# Patient Record
Sex: Female | Born: 1980 | Race: Black or African American | Hispanic: No | Marital: Married | Smoking: Never smoker
Health system: Southern US, Community
[De-identification: ages and names within clinical notes are randomized; demographics above are authoritative.]

## PROBLEM LIST (undated history)

## (undated) DIAGNOSIS — Z973 Presence of spectacles and contact lenses: Secondary | ICD-10-CM

## (undated) DIAGNOSIS — Z8489 Family history of other specified conditions: Secondary | ICD-10-CM

## (undated) DIAGNOSIS — A609 Anogenital herpesviral infection, unspecified: Secondary | ICD-10-CM

## (undated) HISTORY — PX: ANKLE SURGERY: SHX546

## (undated) HISTORY — PX: INDUCED ABORTION: SHX677

## (undated) HISTORY — DX: Anogenital herpesviral infection, unspecified: A60.9

---

## 2006-10-19 HISTORY — PX: ANKLE SURGERY: SHX546

## 2016-11-19 NOTE — L&D Delivery Note (Signed)
Delivery Note Pt complete at 1745. She pushed for 40-74mins and at 6:39 PM a viable female was delivered via Vaginal, Spontaneous Delivery (Presentation:OA ;  ).  APGAR: 9,9 ; weight pending.   Placenta status:delivered, shultz, intact , .  Cord:3vc  with the following complications: nuchal x 1; reduced over infants head.  Cord pH: n/a  Anesthesia:  Epidural Episiotomy: None Lacerations: 2nd degree;Perineal Suture Repair: 2.0 vicryl 4-0 vicryl Est. Blood Loss (mL): 450  Mom to postpartum.  Baby to Couplet care / Skin to Skin.  Isaiah Serge 08/02/2017, 7:05 PM

## 2017-01-03 LAB — OB RESULTS CONSOLE ANTIBODY SCREEN: Antibody Screen: NEGATIVE

## 2017-01-03 LAB — OB RESULTS CONSOLE HEPATITIS B SURFACE ANTIGEN: Hepatitis B Surface Ag: NEGATIVE

## 2017-01-03 LAB — OB RESULTS CONSOLE HIV ANTIBODY (ROUTINE TESTING): HIV: NONREACTIVE

## 2017-01-03 LAB — OB RESULTS CONSOLE ABO/RH: RH Type: POSITIVE

## 2017-01-03 LAB — OB RESULTS CONSOLE RUBELLA ANTIBODY, IGM: Rubella: IMMUNE

## 2017-01-03 LAB — OB RESULTS CONSOLE RPR: RPR: NONREACTIVE

## 2017-03-15 ENCOUNTER — Other Ambulatory Visit (HOSPITAL_COMMUNITY): Payer: Self-pay | Admitting: Obstetrics and Gynecology

## 2017-03-15 DIAGNOSIS — O09521 Supervision of elderly multigravida, first trimester: Secondary | ICD-10-CM

## 2017-04-04 ENCOUNTER — Ambulatory Visit (HOSPITAL_COMMUNITY)
Admission: RE | Admit: 2017-04-04 | Discharge: 2017-04-04 | Disposition: A | Discharge status: Home / Self Care | Payer: 59 | Source: Ambulatory Visit | Attending: Obstetrics and Gynecology | Admitting: Obstetrics and Gynecology

## 2017-04-04 DIAGNOSIS — O09522 Supervision of elderly multigravida, second trimester: Secondary | ICD-10-CM | POA: Diagnosis not present

## 2017-04-04 DIAGNOSIS — Z3A22 22 weeks gestation of pregnancy: Secondary | ICD-10-CM | POA: Insufficient documentation

## 2017-04-04 DIAGNOSIS — O99212 Obesity complicating pregnancy, second trimester: Secondary | ICD-10-CM | POA: Diagnosis not present

## 2017-04-04 DIAGNOSIS — Z3689 Encounter for other specified antenatal screening: Secondary | ICD-10-CM | POA: Diagnosis not present

## 2017-04-04 DIAGNOSIS — E669 Obesity, unspecified: Secondary | ICD-10-CM | POA: Diagnosis not present

## 2017-04-04 DIAGNOSIS — Z6833 Body mass index (BMI) 33.0-33.9, adult: Secondary | ICD-10-CM | POA: Diagnosis not present

## 2017-04-04 DIAGNOSIS — O09521 Supervision of elderly multigravida, first trimester: Secondary | ICD-10-CM

## 2017-07-04 LAB — OB RESULTS CONSOLE GBS: STREP GROUP B AG: NEGATIVE

## 2017-07-30 ENCOUNTER — Inpatient Hospital Stay (HOSPITAL_COMMUNITY): Admission: RE | Admit: 2017-07-30 | Payer: 59 | Source: Ambulatory Visit

## 2017-08-01 ENCOUNTER — Telehealth (HOSPITAL_COMMUNITY): Payer: Self-pay | Admitting: *Deleted

## 2017-08-01 ENCOUNTER — Encounter (HOSPITAL_COMMUNITY): Payer: Self-pay | Admitting: *Deleted

## 2017-08-01 NOTE — Telephone Encounter (Signed)
Preadmission screen  

## 2017-08-01 NOTE — H&P (Signed)
Felicia Banks is a 36 y.o. female presenting for scheduled elective iol at term. Pt had a benign prenatal course. Dating per LMP and confirmed with first trimester [place]. GBS neg. Pt requests elective iol for discomfort and social reasons  OB History    Gravida Para Term Preterm AB Living   2       1     SAB TAB Ectopic Multiple Live Births     1           Past Medical History:  Diagnosis Date  . HSV (herpes simplex virus) anogenital infection    Past Surgical History:  Procedure Laterality Date  . ANKLE SURGERY     left  . INDUCED ABORTION     Family History: family history is not on file. Social History:  has no tobacco, alcohol, and drug history on file.     Maternal Diabetes: No Genetic Screening: Normal Maternal Ultrasounds/Referrals: Normal Fetal Ultrasounds or other Referrals:  None Maternal Substance Abuse:  No Significant Maternal Medications:  None Significant Maternal Lab Results:  Lab values include: Group B Strep negative Other Comments:  None  Review of Systems  Constitutional: Positive for malaise/fatigue. Negative for chills, fever and weight loss.  Eyes: Negative for blurred vision.  Respiratory: Negative for shortness of breath.   Cardiovascular: Negative for chest pain.  Gastrointestinal: Positive for abdominal pain. Negative for heartburn, nausea and vomiting.  Genitourinary: Negative for dysuria.  Musculoskeletal: Positive for back pain and myalgias.  Skin: Negative for itching and rash.  Neurological: Negative for dizziness, tingling and headaches.  Endo/Heme/Allergies: Negative for environmental allergies. Does not bruise/bleed easily.  Psychiatric/Behavioral: Negative for depression, hallucinations, substance abuse and suicidal ideas. The patient is nervous/anxious.    Maternal Medical History:  Reason for admission: Contractions.  Nausea. Elective iol  Contractions: Onset was 2 days ago.   Frequency: irregular and rare.   Perceived severity  is moderate.    Fetal activity: Perceived fetal activity is normal.   Last perceived fetal movement was within the past hour.    Prenatal complications: no prenatal complications Prenatal Complications - Diabetes: none.      Last menstrual period 10/28/2016. Maternal Exam:  Uterine Assessment: Contraction strength is mild.  Contraction frequency is irregular and rare.   Abdomen: Patient reports generalized tenderness.  Estimated fetal weight is AGA.   Fetal presentation: vertex  Introitus: Normal vulva. Vulva is negative for condylomata and lesion.  Normal vagina.  Vagina is negative for condylomata and discharge.  Pelvis: adequate for delivery.   Cervix: Cervix evaluated by digital exam.     Physical Exam  Constitutional: She is oriented to person, place, and time. She appears well-developed and well-nourished.  HENT:  Head: Normocephalic.  Neck: Normal range of motion.  Cardiovascular: Normal rate.   Respiratory: Effort normal.  GI: Soft. Bowel sounds are normal. There is generalized tenderness.  Genitourinary: Vagina normal and uterus normal. Vulva exhibits no lesion. No vaginal discharge found.  Musculoskeletal: Normal range of motion. She exhibits edema.  Neurological: She is alert and oriented to person, place, and time.  Skin: Skin is warm.  Psychiatric: She has a normal mood and affect. Her behavior is normal. Judgment and thought content normal.    Prenatal labs: ABO, Rh: O/Positive/-- (02/15 0000) Antibody: Negative (02/15 0000) Rubella: Immune (02/15 0000) RPR: Nonreactive (02/15 0000)  HBsAg: Negative (02/15 0000)  HIV: Non-reactive (02/15 0000)  GBS: Negative (08/16 0000)   Assessment/Plan: G2P0010 at term for elective iol Cytotec  for ripening then pitocin/AROM to follow  Pain control prn Anticipate svd GBS neg   Venetia Night Banga 08/01/2017, 1:14 PM

## 2017-08-02 ENCOUNTER — Encounter (HOSPITAL_COMMUNITY): Payer: Self-pay

## 2017-08-02 ENCOUNTER — Inpatient Hospital Stay (HOSPITAL_COMMUNITY): Payer: 59 | Admitting: Anesthesiology

## 2017-08-02 ENCOUNTER — Inpatient Hospital Stay (HOSPITAL_COMMUNITY)
Admission: RE | Admit: 2017-08-02 | Discharge: 2017-08-04 | DRG: 774 | Disposition: A | Discharge status: Home / Self Care | Payer: 59 | Source: Ambulatory Visit | Attending: Obstetrics and Gynecology | Admitting: Obstetrics and Gynecology

## 2017-08-02 DIAGNOSIS — Z3A39 39 weeks gestation of pregnancy: Secondary | ICD-10-CM

## 2017-08-02 DIAGNOSIS — O9832 Other infections with a predominantly sexual mode of transmission complicating childbirth: Secondary | ICD-10-CM | POA: Diagnosis present

## 2017-08-02 DIAGNOSIS — A6000 Herpesviral infection of urogenital system, unspecified: Secondary | ICD-10-CM | POA: Diagnosis present

## 2017-08-02 DIAGNOSIS — O26893 Other specified pregnancy related conditions, third trimester: Secondary | ICD-10-CM | POA: Diagnosis present

## 2017-08-02 DIAGNOSIS — Z3490 Encounter for supervision of normal pregnancy, unspecified, unspecified trimester: Secondary | ICD-10-CM

## 2017-08-02 LAB — CBC
HCT: 31 % — ABNORMAL LOW (ref 36.0–46.0)
HEMATOCRIT: 34.9 % — AB (ref 36.0–46.0)
Hemoglobin: 12.4 g/dL (ref 12.0–15.0)
Hemoglobin: 10.8 g/dL — ABNORMAL LOW (ref 12.0–15.0)
MCH: 28.7 pg (ref 26.0–34.0)
MCH: 28.6 pg (ref 26.0–34.0)
MCHC: 35.5 g/dL (ref 30.0–36.0)
MCHC: 34.8 g/dL (ref 30.0–36.0)
MCV: 80.8 fL (ref 78.0–100.0)
MCV: 82 fL (ref 78.0–100.0)
Platelets: 165 10*3/uL (ref 150–400)
Platelets: 149 10*3/uL — ABNORMAL LOW (ref 150–400)
RBC: 4.32 MIL/uL (ref 3.87–5.11)
RBC: 3.78 MIL/uL — ABNORMAL LOW (ref 3.87–5.11)
RDW: 16.9 % — AB (ref 11.5–15.5)
RDW: 16.8 % — ABNORMAL HIGH (ref 11.5–15.5)
WBC: 8.2 10*3/uL (ref 4.0–10.5)
WBC: 10.9 10*3/uL — ABNORMAL HIGH (ref 4.0–10.5)

## 2017-08-02 LAB — RPR: RPR Ser Ql: NONREACTIVE

## 2017-08-02 LAB — TYPE AND SCREEN
ABO/RH(D): O POS
Antibody Screen: NEGATIVE

## 2017-08-02 LAB — ABO/RH: ABO/RH(D): O POS

## 2017-08-02 MED ORDER — EPHEDRINE 5 MG/ML INJ
10.0000 mg | INTRAVENOUS | Status: DC | PRN
  Filled 2017-08-02: qty 2

## 2017-08-02 MED ORDER — WITCH HAZEL-GLYCERIN EX PADS: 1.0000 "application " | MEDICATED_PAD | CUTANEOUS | Status: DC | PRN

## 2017-08-02 MED ORDER — ONDANSETRON HCL 4 MG/2ML IJ SOLN: 4.0000 mg | INTRAMUSCULAR | Status: DC | PRN

## 2017-08-02 MED ORDER — SOD CITRATE-CITRIC ACID 500-334 MG/5ML PO SOLN: 30.0000 mL | ORAL | Status: DC | PRN

## 2017-08-02 MED ORDER — MISOPROSTOL 25 MCG QUARTER TABLET
25.0000 ug | ORAL_TABLET | ORAL | Status: DC | PRN
  Administered 2017-08-02: 25 ug via VAGINAL
  Filled 2017-08-02 (×2): qty 1

## 2017-08-02 MED ORDER — ZOLPIDEM TARTRATE 5 MG PO TABS: 5.0000 mg | ORAL_TABLET | Freq: Every evening | ORAL | Status: DC | PRN

## 2017-08-02 MED ORDER — TETANUS-DIPHTH-ACELL PERTUSSIS 5-2.5-18.5 LF-MCG/0.5 IM SUSP: 0.5000 mL | Freq: Once | INTRAMUSCULAR | Status: DC

## 2017-08-02 MED ORDER — LACTATED RINGERS IV SOLN
500.0000 mL | INTRAVENOUS | Status: DC | PRN
  Administered 2017-08-02: 300 mL via INTRAVENOUS

## 2017-08-02 MED ORDER — ACETAMINOPHEN 325 MG PO TABS
650.0000 mg | ORAL_TABLET | ORAL | Status: DC | PRN
Start: 2017-08-02 — End: 2017-08-04

## 2017-08-02 MED ORDER — SIMETHICONE 80 MG PO CHEW: 80.0000 mg | CHEWABLE_TABLET | ORAL | Status: DC | PRN

## 2017-08-02 MED ORDER — LIDOCAINE HCL (PF) 1 % IJ SOLN
INTRAMUSCULAR | Status: DC | PRN
  Administered 2017-08-02: 8 mL via EPIDURAL

## 2017-08-02 MED ORDER — FENTANYL 2.5 MCG/ML BUPIVACAINE 1/10 % EPIDURAL INFUSION (WH - ANES)
14.0000 mL/h | INTRAMUSCULAR | Status: DC | PRN
  Administered 2017-08-02 (×2): 14 mL/h via EPIDURAL
  Filled 2017-08-02 (×2): qty 100

## 2017-08-02 MED ORDER — OXYCODONE HCL 5 MG PO TABS: 5.0000 mg | ORAL_TABLET | ORAL | Status: DC | PRN

## 2017-08-02 MED ORDER — DIPHENHYDRAMINE HCL 50 MG/ML IJ SOLN: 12.5000 mg | INTRAMUSCULAR | Status: DC | PRN

## 2017-08-02 MED ORDER — TERBUTALINE SULFATE 1 MG/ML IJ SOLN
0.2500 mg | Freq: Once | INTRAMUSCULAR | Status: DC | PRN
  Filled 2017-08-02: qty 1

## 2017-08-02 MED ORDER — LIDOCAINE HCL (PF) 1 % IJ SOLN
30.0000 mL | INTRAMUSCULAR | Status: DC | PRN
  Filled 2017-08-02: qty 30

## 2017-08-02 MED ORDER — DIBUCAINE 1 % RE OINT: 1.0000 "application " | TOPICAL_OINTMENT | RECTAL | Status: DC | PRN

## 2017-08-02 MED ORDER — PRENATAL MULTIVITAMIN CH
1.0000 | ORAL_TABLET | Freq: Every day | ORAL | Status: DC
  Administered 2017-08-03: 1 via ORAL
  Filled 2017-08-02: qty 1

## 2017-08-02 MED ORDER — SENNOSIDES-DOCUSATE SODIUM 8.6-50 MG PO TABS
2.0000 | ORAL_TABLET | ORAL | Status: DC
  Filled 2017-08-02 (×2): qty 2

## 2017-08-02 MED ORDER — ACETAMINOPHEN 325 MG PO TABS: 650.0000 mg | ORAL_TABLET | ORAL | Status: DC | PRN

## 2017-08-02 MED ORDER — OXYCODONE HCL 5 MG PO TABS: 10.0000 mg | ORAL_TABLET | ORAL | Status: DC | PRN

## 2017-08-02 MED ORDER — ONDANSETRON HCL 4 MG/2ML IJ SOLN: 4.0000 mg | Freq: Four times a day (QID) | INTRAMUSCULAR | Status: DC | PRN

## 2017-08-02 MED ORDER — PHENYLEPHRINE 40 MCG/ML (10ML) SYRINGE FOR IV PUSH (FOR BLOOD PRESSURE SUPPORT)
80.0000 ug | PREFILLED_SYRINGE | INTRAVENOUS | Status: DC | PRN
  Filled 2017-08-02: qty 5
  Filled 2017-08-02: qty 10

## 2017-08-02 MED ORDER — LACTATED RINGERS IV SOLN: INTRAVENOUS | Status: DC

## 2017-08-02 MED ORDER — BENZOCAINE-MENTHOL 20-0.5 % EX AERO
1.0000 "application " | INHALATION_SPRAY | CUTANEOUS | Status: DC | PRN
  Administered 2017-08-03: 1 via TOPICAL
  Filled 2017-08-02: qty 56

## 2017-08-02 MED ORDER — COCONUT OIL OIL: 1.0000 "application " | TOPICAL_OIL | Status: DC | PRN

## 2017-08-02 MED ORDER — OXYTOCIN BOLUS FROM INFUSION
500.0000 mL | Freq: Once | INTRAVENOUS | Status: AC
  Administered 2017-08-02: 500 mL via INTRAVENOUS

## 2017-08-02 MED ORDER — LACTATED RINGERS IV SOLN
INTRAVENOUS | Status: DC
  Administered 2017-08-02 (×2): via INTRAVENOUS

## 2017-08-02 MED ORDER — OXYTOCIN 40 UNITS IN LACTATED RINGERS INFUSION - SIMPLE MED: 2.5000 [IU]/h | INTRAVENOUS | Status: DC

## 2017-08-02 MED ORDER — OXYCODONE-ACETAMINOPHEN 5-325 MG PO TABS: 1.0000 | ORAL_TABLET | ORAL | Status: DC | PRN

## 2017-08-02 MED ORDER — PHENYLEPHRINE 40 MCG/ML (10ML) SYRINGE FOR IV PUSH (FOR BLOOD PRESSURE SUPPORT)
80.0000 ug | PREFILLED_SYRINGE | INTRAVENOUS | Status: DC | PRN
  Filled 2017-08-02: qty 5

## 2017-08-02 MED ORDER — OXYTOCIN 40 UNITS IN LACTATED RINGERS INFUSION - SIMPLE MED
1.0000 m[IU]/min | INTRAVENOUS | Status: DC
  Administered 2017-08-02: 2 m[IU]/min via INTRAVENOUS
  Filled 2017-08-02: qty 1000

## 2017-08-02 MED ORDER — DIPHENHYDRAMINE HCL 25 MG PO CAPS: 25.0000 mg | ORAL_CAPSULE | Freq: Four times a day (QID) | ORAL | Status: DC | PRN

## 2017-08-02 MED ORDER — BUTORPHANOL TARTRATE 1 MG/ML IJ SOLN: 1.0000 mg | INTRAMUSCULAR | Status: DC | PRN

## 2017-08-02 MED ORDER — IBUPROFEN 600 MG PO TABS
600.0000 mg | ORAL_TABLET | Freq: Four times a day (QID) | ORAL | Status: DC
  Administered 2017-08-03 – 2017-08-04 (×6): 600 mg via ORAL
  Filled 2017-08-02 (×6): qty 1

## 2017-08-02 MED ORDER — ONDANSETRON HCL 4 MG PO TABS: 4.0000 mg | ORAL_TABLET | ORAL | Status: DC | PRN

## 2017-08-02 MED ORDER — OXYCODONE-ACETAMINOPHEN 5-325 MG PO TABS: 2.0000 | ORAL_TABLET | ORAL | Status: DC | PRN

## 2017-08-02 MED ORDER — LACTATED RINGERS IV SOLN: 500.0000 mL | Freq: Once | INTRAVENOUS | Status: DC

## 2017-08-02 NOTE — Progress Notes (Signed)
Pt up to BR. Pt stated she didn't feel good and felt like she was going to pass out. Pt did pass out. RN called for assistance and pt helped back to bed.

## 2017-08-02 NOTE — Anesthesia Procedure Notes (Signed)
Epidural Patient location during procedure: OB Start time: 08/02/2017 10:27 AM End time: 08/02/2017 10:32 AM  Staffing Anesthesiologist: Lyn Hollingshead Performed: anesthesiologist   Preanesthetic Checklist Completed: patient identified, surgical consent, pre-op evaluation, timeout performed, IV checked, risks and benefits discussed and monitors and equipment checked  Epidural Patient position: sitting Prep: site prepped and draped and DuraPrep Patient monitoring: continuous pulse ox and blood pressure Approach: midline Location: L3-L4 Injection technique: LOR air  Needle:  Needle type: Tuohy  Needle gauge: 17 G Needle length: 9 cm and 9 Needle insertion depth: 9 cm Catheter type: closed end flexible Catheter size: 19 Gauge Catheter at skin depth: 15 cm Test dose: negative and Other  Assessment Sensory level: T10 Events: blood not aspirated, injection not painful, no injection resistance, negative IV test and no paresthesia  Additional Notes Reason for block:procedure for pain

## 2017-08-02 NOTE — Anesthesia Pain Management Evaluation Note (Signed)
  CRNA Pain Management Visit Note  Patient: Felicia Banks, 36 y.o., female  "Hello I am a member of the anesthesia team at [facility]. We have an anesthesia team available at all times to provide care throughout the hospital, including epidural management and anesthesia for C-section. I don't know your plan for the delivery whether it a natural birth, water birth, IV sedation, nitrous supplementation, doula or epidural, but we want to meet your pain goals."   1.Was your pain managed to your expectations on prior hospitalizations?   Yes   2.What is your expectation for pain management during this hospitalization?     Epidural  3.How can we help you reach that goal? unsure  Record the patient's initial score and the patient's pain goal.   Pain: 6  Pain Goal: 8 [facility] wants you to be able to say your pain was always managed very well.  Felicia Banks 08/02/2017

## 2017-08-02 NOTE — Anesthesia Preprocedure Evaluation (Signed)
Anesthesia Evaluation  Patient identified by MRN, date of birth, ID band Patient awake    Reviewed: Allergy & Precautions, H&P , NPO status , Patient's Chart, lab work & pertinent test results  Airway Mallampati: II  TM Distance: >3 FB Neck ROM: full    Dental no notable dental hx. (+) Teeth Intact   Pulmonary neg pulmonary ROS,    Pulmonary exam normal breath sounds clear to auscultation       Cardiovascular negative cardio ROS Normal cardiovascular exam Rhythm:regular Rate:Normal     Neuro/Psych negative neurological ROS  negative psych ROS   GI/Hepatic negative GI ROS, Neg liver ROS,   Endo/Other  negative endocrine ROS  Renal/GU negative Renal ROS     Musculoskeletal   Abdominal (+) + obese,   Peds  Hematology negative hematology ROS (+)   Anesthesia Other Findings   Reproductive/Obstetrics (+) Pregnancy                             Anesthesia Physical Anesthesia Plan  ASA: II  Anesthesia Plan: Epidural   Post-op Pain Management:    Induction:   PONV Risk Score and Plan:   Airway Management Planned:   Additional Equipment:   Intra-op Plan:   Post-operative Plan:   Informed Consent: I have reviewed the patients History and Physical, chart, labs and discussed the procedure including the risks, benefits and alternatives for the proposed anesthesia with the patient or authorized representative who has indicated his/her understanding and acceptance.     Plan Discussed with:   Anesthesia Plan Comments:         Anesthesia Quick Evaluation

## 2017-08-02 NOTE — Progress Notes (Signed)
Patient ID: Felicia Banks, female   DOB: 1981/01/07, 36 y.o.   MRN: [id number] Pt reports painful contractions.  EFM - 130, cat 1 TOCO - ctxs q 1-33mins Cervix 3cm/90/-3 Attempted AROM; pitocin per protocol Pain control prn Recheck in 2hrs or prn

## 2017-08-02 NOTE — Progress Notes (Signed)
Patient ID: Felicia Banks, female   DOB: 05/07/1981, 36 y.o.   MRN: [id number] Pt doing well. Comfortable with epidural VSS EFM - cat 1 130 TOCO - ctxs q 66mins SVE 4/90/-2  AROM performed with moderate return of clear fluid ( pink tinged  A/P: G2P0010 at 39 + in early active labor, pit at 82mus         Continue pit per protocol         Recheck in 2-3hrs or prn         Anticipate svd

## 2017-08-02 NOTE — Progress Notes (Signed)
Patient ID: Felicia Banks, female   DOB: 11/25/1980, 36 y.o.   MRN: [id number] Pt still doing well VSS Cat 1, 130  Ctxs q 58mins 8/90/-2 Pit at 38mus  A/P: G2P0: Progressing on pit         Continue current mgmt         Recheck in 2-3hrs          Anticipate svd

## 2017-08-03 ENCOUNTER — Encounter (HOSPITAL_COMMUNITY): Payer: Self-pay

## 2017-08-03 LAB — CBC
HEMATOCRIT: 29.2 % — AB (ref 36.0–46.0)
Hemoglobin: 10.1 g/dL — ABNORMAL LOW (ref 12.0–15.0)
MCH: 28.5 pg (ref 26.0–34.0)
MCHC: 34.6 g/dL (ref 30.0–36.0)
MCV: 82.3 fL (ref 78.0–100.0)
Platelets: 162 10*3/uL (ref 150–400)
RBC: 3.55 MIL/uL — AB (ref 3.87–5.11)
RDW: 17 % — AB (ref 11.5–15.5)
WBC: 12.9 10*3/uL — AB (ref 4.0–10.5)

## 2017-08-03 NOTE — Progress Notes (Signed)
Patient ID: Felicia Banks, female   DOB: Apr 06, 1981, 36 y.o.   MRN: [id number] PPD #1 Has passed out twice when standing Afeb, VSS Fundus firm, NT at U-1 Hgb 10.6 to 10.1 Continue routine postpartum care, ambulate with assistance, push fluids

## 2017-08-03 NOTE — Anesthesia Postprocedure Evaluation (Signed)
Anesthesia Post Note  Patient: Felicia Banks  Procedure(s) Performed: * No procedures listed *     Patient location during evaluation: Mother Baby Anesthesia Type: Epidural Level of consciousness: awake and alert Pain management: pain level controlled Vital Signs Assessment: post-procedure vital signs reviewed and stable Respiratory status: spontaneous breathing, nonlabored ventilation and respiratory function stable Cardiovascular status: stable Postop Assessment: no headache, no backache, epidural receding and patient able to bend at knees Anesthetic complications: no    Last Vitals:  Vitals:   08/02/17 2302 08/03/17 0017  BP: 130/76 103/70  Pulse: (!) 109 98  Resp: 18 20  Temp: 37.2 C 37.4 C  SpO2: 100% 99%    Last Pain:  Vitals:   08/03/17 0017  TempSrc: Oral  PainSc: 4    Pain Goal:                 Rayvon Char

## 2017-08-04 ENCOUNTER — Inpatient Hospital Stay (HOSPITAL_COMMUNITY): Admission: AD | Admit: 2017-08-04 | Payer: Self-pay | Source: Ambulatory Visit | Admitting: Obstetrics and Gynecology

## 2017-08-04 MED ORDER — IBUPROFEN 600 MG PO TABS: 600.0000 mg | ORAL_TABLET | Freq: Four times a day (QID) | ORAL | 0 refills | Status: DC

## 2017-08-04 NOTE — Discharge Instructions (Signed)
As per discharge pamphlet °

## 2017-08-04 NOTE — Discharge Summary (Signed)
OB Discharge Summary     Patient Name: Felicia Banks DOB: Aug 14, 1981 MRN: [id number]  Date of admission: 08/02/2017 Delivering MD: Carlynn Purl [facility]   Date of discharge: 08/04/2017  Admitting diagnosis: INDUCTION Intrauterine pregnancy: [redacted]w[redacted]d     Secondary diagnosis:  Active Problems:   Term pregnancy   SVD (spontaneous vaginal delivery)   Postpartum care following vaginal delivery      Discharge diagnosis: Term Pregnancy [facility] course:  Induction of Labor With Vaginal Delivery   36 y.o. yo G2P1010 at [redacted]w[redacted]d was admitted to the hospital 08/02/2017 for induction of labor.  Indication for induction: Favorable cervix at term.  Patient had an uncomplicated labor course as follows: Membrane Rupture Time/Date: 1:16 PM ,08/02/2017   Intrapartum Procedures: Episiotomy: None [1]                                         Lacerations:  2nd degree [3];Perineal [11]  Patient had delivery of a Viable infant.  Information for the patient's newborn:  Felicia, Banks Girl Felicia [[id number]]  Delivery Method: Vaginal, Spontaneous Delivery (Filed from Delivery Summary)   08/02/2017  Details of delivery can be found in separate delivery note.  Patient had a routine postpartum course. Patient is discharged home 08/04/17.  Physical exam  Vitals:   08/03/17 0017 08/03/17 0900 08/03/17 1807 08/04/17 0700  BP: 103/70 (!) 122/54 133/71 119/74  Pulse: 98 (!) 103 100 86  Resp: 20 18 19 18   Temp: 99.4 F (37.4 C) 98.9 F (37.2 C) 98 F (36.7 C) 98 F (36.7 C)  TempSrc: Oral Oral Oral Oral  SpO2: 99% 99% 95% 99%  Weight:      Height:       General: alert Lochia: appropriate Uterine Fundus: firm  Labs: Lab Results  Component Value Date   WBC 12.9 (H) 08/03/2017   HGB 10.1 (L) 08/03/2017   HCT 29.2 (L) 08/03/2017   MCV 82.3 08/03/2017   PLT 162 08/03/2017   No flowsheet data found.  Discharge instruction: per After Visit Summary and "Baby and Me  Booklet".  After visit meds:  Allergies as of 08/04/2017   No Known Allergies     Medication List    STOP taking these medications   folic acid 1 MG tablet Commonly known as:  FOLVITE     TAKE these medications   ibuprofen 600 MG tablet Commonly known as:  ADVIL,MOTRIN Take 1 tablet (600 mg total) by mouth every 6 (six) hours.   prenatal multivitamin Tabs tablet Take 1 tablet by mouth daily at 12 noon.   valACYclovir 500 MG tablet Commonly known as:  VALTREX            Discharge Care Instructions        Start     Ordered   08/04/17 0000  ibuprofen (ADVIL,MOTRIN) 600 MG tablet  Every 6 hours     08/04/17 0827   08/04/17 0000  Diet - low sodium heart healthy     08/04/17 0827      Diet: routine diet  Activity: Advance as tolerated. Pelvic rest for 6 weeks.   Outpatient follow up:3 weeks  Newborn Data: Live born female  Birth Weight:  8 lb 6.4 oz (3810 g) APGAR: 9, 9  Baby Feeding: Breast Disposition:home with mother   08/04/2017 Felicia Duke, MD

## 2017-08-04 NOTE — Lactation Note (Signed)
This note was copied from a baby's chart. Lactation Consultation Note  Patient Name: Felicia Banks [id number] Date: 08/04/2017 Reason for consult: Initial assessment;Infant weight loss  Baby is 38 hours old/ 5% weight loss.  LC reviewed and updated doc flow sheets per mom  As [place] entered room, baby latched on the left breast / football/ wrapped in a blanket and  And wearing a T- Shirt. LC noted the baby to be non - nutritive , showed mom breast compressions  And noted the baby's feeding pattern to pick up slightly. Baby released and noted the nipple to be slanted.  So depth wasn't achieved.  LC changed a larger wet and unwrapped , and placed baby STS on the right breast / 1st cross cradle,  And baby wouldn't stay latched , so switched to the football. LC assisted to latch and showed mom how to obtain  The depth. Baby has a tendency to nibble on to the breast.  [place] worked with mom to tickle the upper  lip and wait for the baby to open wide to allow for a deeper latch.  Depth achieved, but the baby sits, occasional sucking.  LC showed dad and grandmother how they could assist to insert the 9FSNS so the baby will be in an active  Feeding pattern when latched. Per mom the baby had a are hard time settling down last night.  Right nipple noted to have a positional strip, and the both areolas have edema , semi - compressible.  LC feels the mom may have not been obtaining the depth consistently.  Sore  Nipple and engorgement prevention and tx reviewed.  LC recommended wearing breast shells between feedings to enhance the areola to be more compressible  Prior to the latch - breast massage , hand express, pre-pump if needed , latch with breast compressions until swallows.  Then use the 9F SNS and supplement if needed.  Due to moms extra bleeding - suggested after some rest at home , add extra post pumping after 3-4 feedings until milk  Comes in, and supplement back to baby.  Mother informed of  post-discharge support and given phone number to the lactation department, including services for phone call assistance; out-patient appointments; and breastfeeding support group. List of other breastfeeding resources in the community given in the handout. Encouraged mother to call for problems or concerns related to breastfeeding.  Per mom has a DEBP at home.     Maternal Data Has patient been taught Hand Expression?: Yes (unable to express drops ) Does the patient have breastfeeding experience prior to this delivery?: No  Feeding Feeding Type: Formula Length of feed: 15 min  LATCH Score Latch: Grasps breast easily, tongue down, lips flanged, rhythmical sucking.  Audible Swallowing: Spontaneous and intermittent  Type of Nipple: Everted at rest and after stimulation (swollen areolas , semi compressible / positional strips )  Comfort (Breast/Nipple): Soft / non-tender  Hold (Positioning): Assistance needed to correctly position infant at breast and maintain latch.  LATCH Score: 9  Interventions Interventions: Breast feeding basics reviewed;Assisted with latch;Skin to skin;Breast massage;Hand express;Pre-pump if needed;Reverse pressure;Breast compression;Adjust position;Support pillows;Position options;Shells;Comfort gels;Hand pump;DEBP  Lactation Tools Discussed/Used Tools: Shells;Pump;9F feeding tube / Syringe Shell Type: Inverted Breast pump type: Manual;Double-Electric Breast Pump WIC Program: No   Consult Status Consult Status: Complete Date: 08/04/17 Follow-up type: In-patient    [place] 08/04/2017, 10:02 AM

## 2017-08-04 NOTE — Progress Notes (Signed)
Patient ID: Felicia Banks, female   DOB: [birth year], 36 y.o.   MRN: [id number] PPD #2 Doing well Afeb, VSS D/c home

## 2017-08-06 ENCOUNTER — Telehealth (HOSPITAL_COMMUNITY): Payer: Self-pay | Admitting: Lactation Services

## 2017-08-06 NOTE — Telephone Encounter (Signed)
Called mom back at request of OP clinic. Mom reports she is very engorged and is unable to feed her infant. Discussed ice packs x 20 minutes followed by reverse pressure and pumping for 15-20 minutes using hands on pumping,  repeating process every 2 hours until engorgement resolved. Discussed importance of resolving engorgement to protect milk supply. Mom has [place] DEBP at home for use. Enc mom to put infant back to breast as soon as areola is softer. Mom reports she spoke to [facility] and has given infant formula, enc her to give EBM once flowing and to get infant back to breast. Mom reports infant is 56 days old and asked how much to feed infant, informed her 40-60 ml every 3 hours, she is very concerned she has not been able to feed her infant this morning at the breast. Mom voiced understanding to the above. Mom has appt scheduled for tomorrow and will let us know if she is feeling better in the morning.

## 2018-07-05 IMAGING — US US MFM OB DETAIL+14 WK
1 series · 14 of 28 positions shown · non-contrast
Comparison: none

[Series 1: us mfm ob detail+14 wk · 95 acquisitions, 14 frames shown]
[im 4/95]
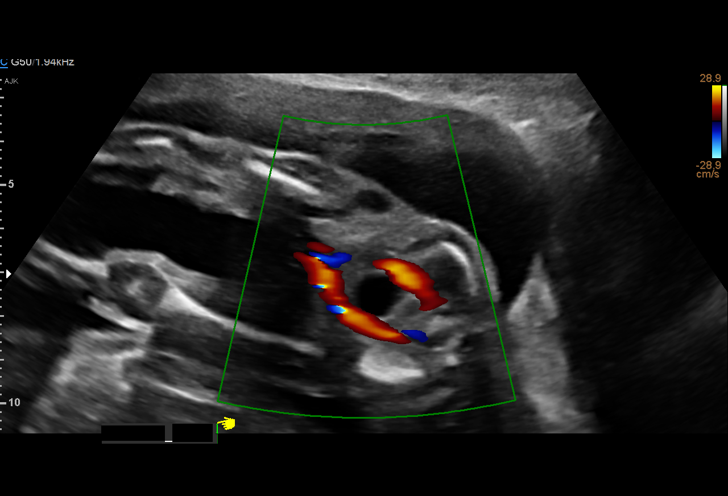
[im 11/95]
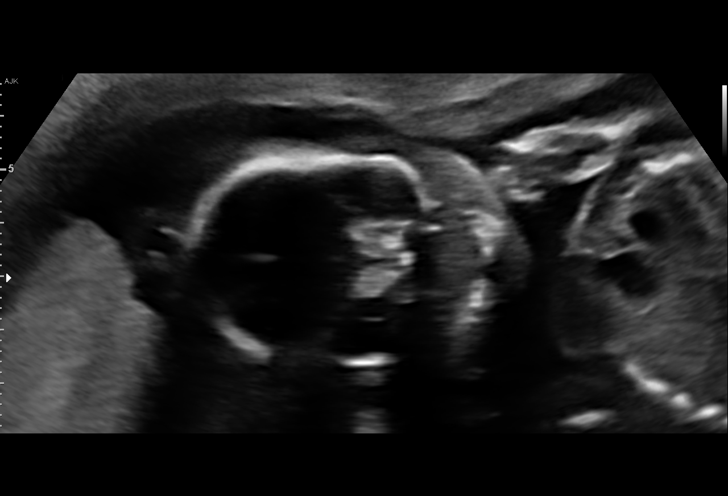
[im 18/95]
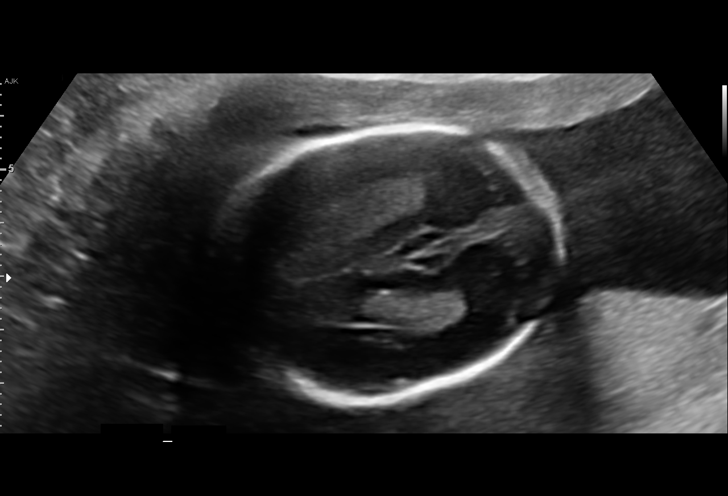
[im 25/95]
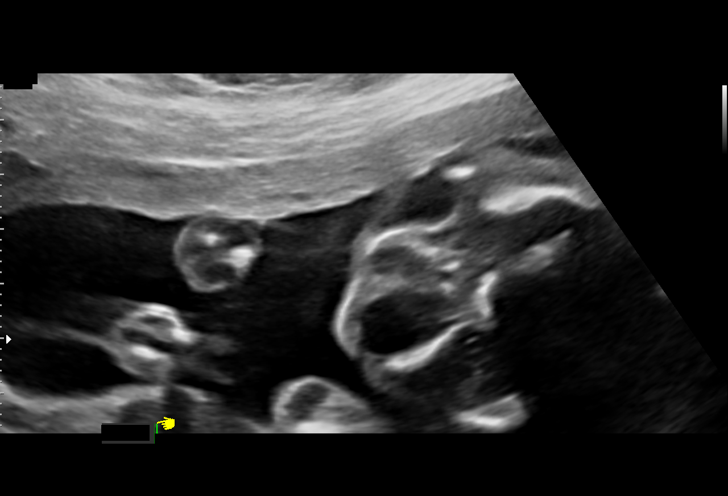
[im 32/95]
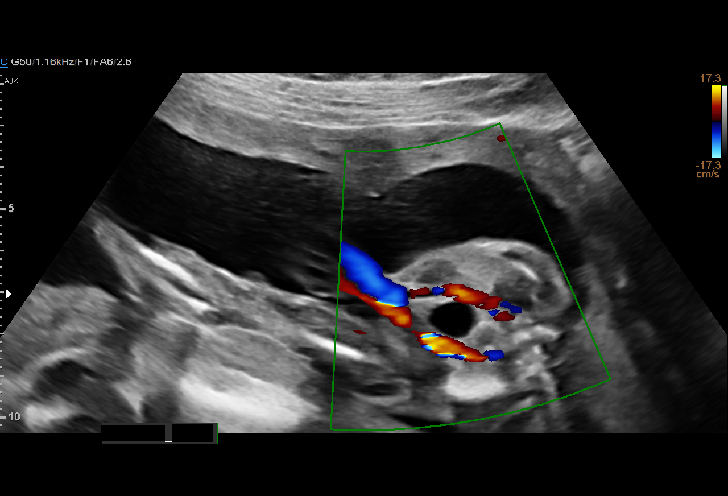
[im 39/95]
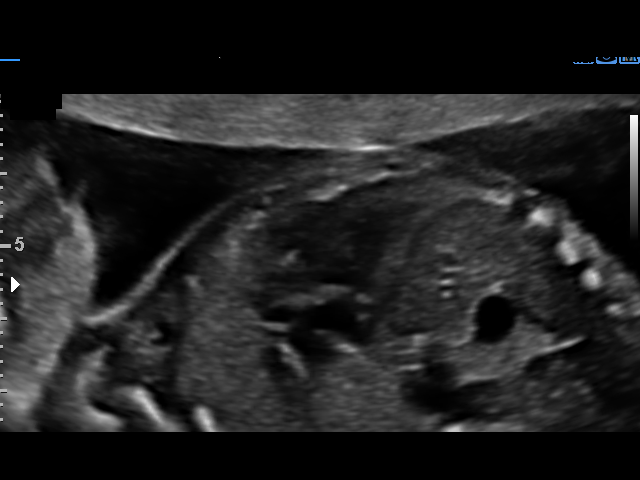
[im 46/95]
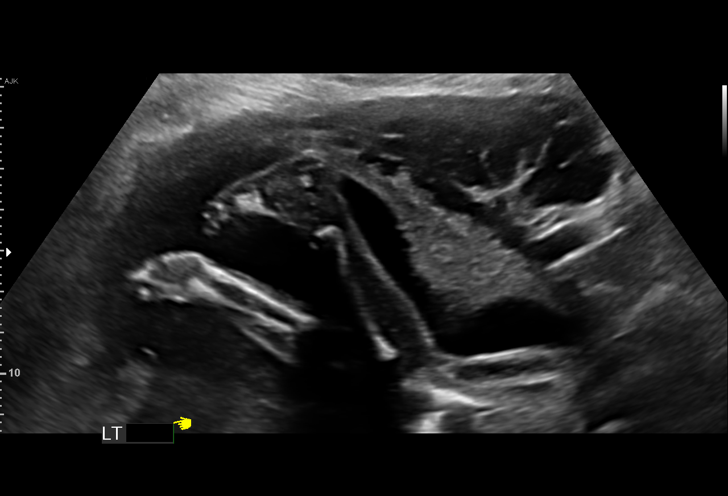
[im 53/95]
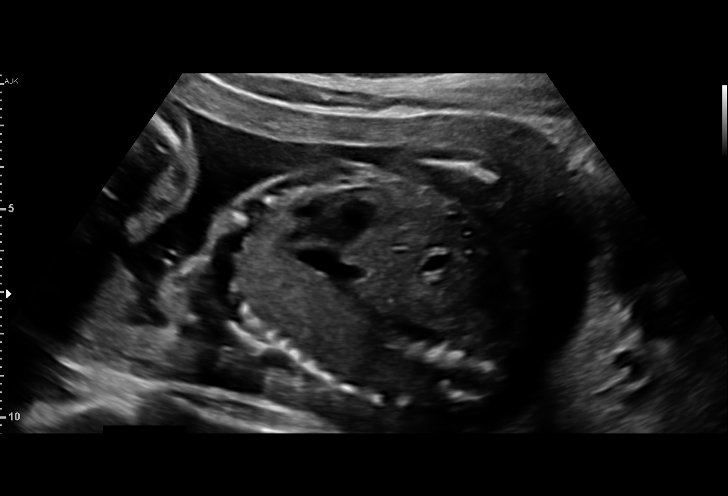
[im 60/95]
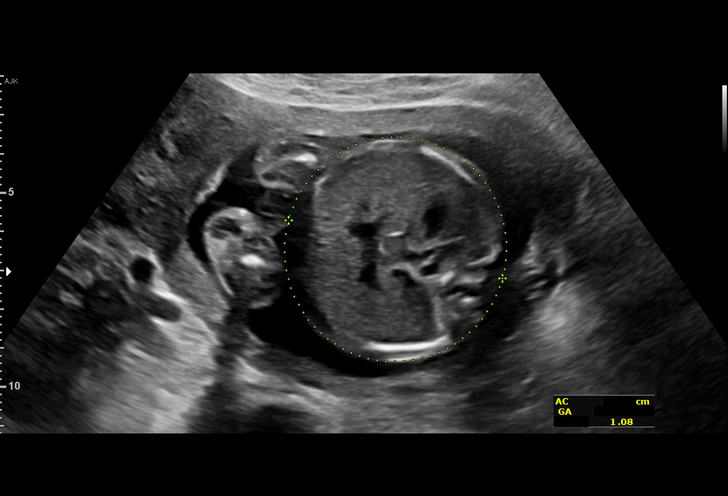
[im 67/95]
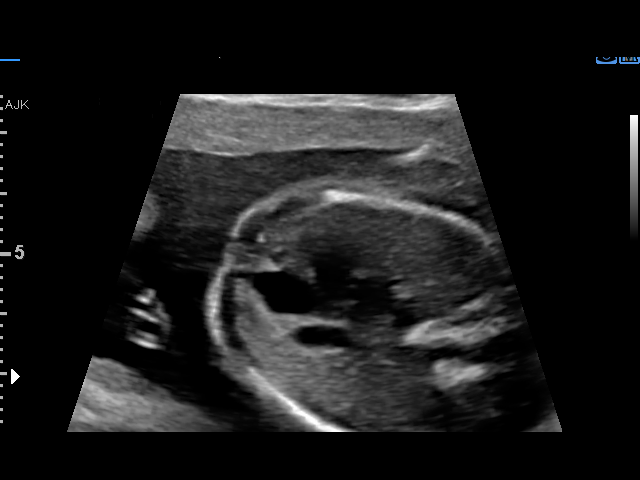
[im 74/95]
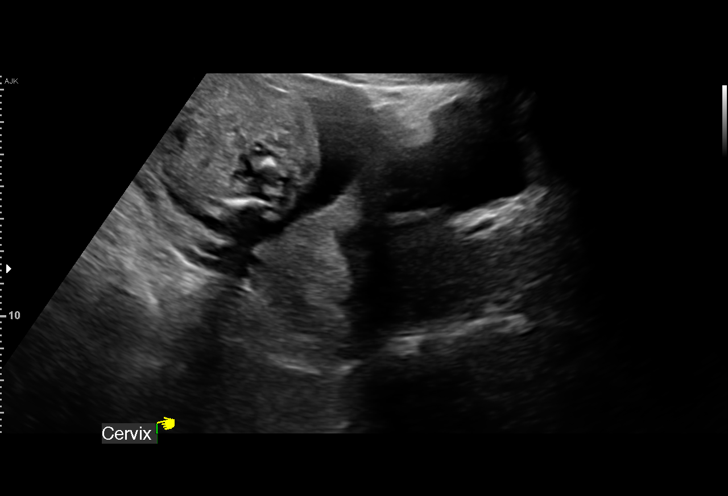
[im 81/95]
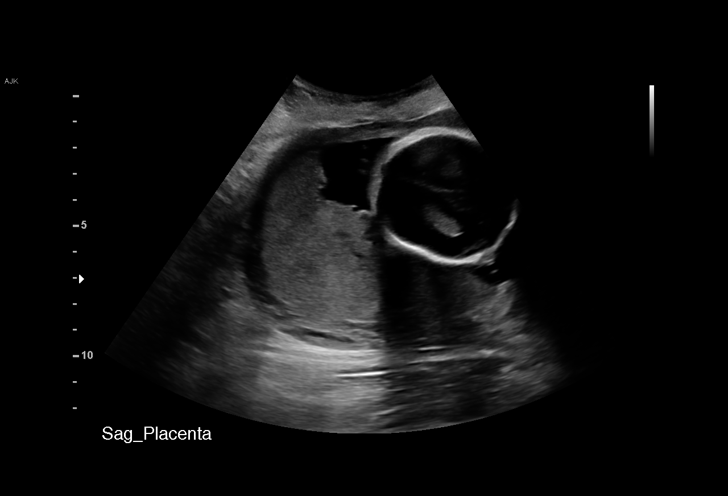
[im 88/95]
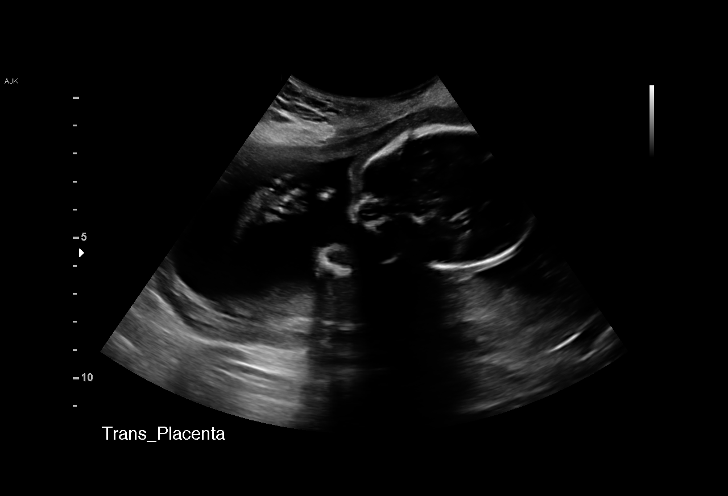
[im 95/95]
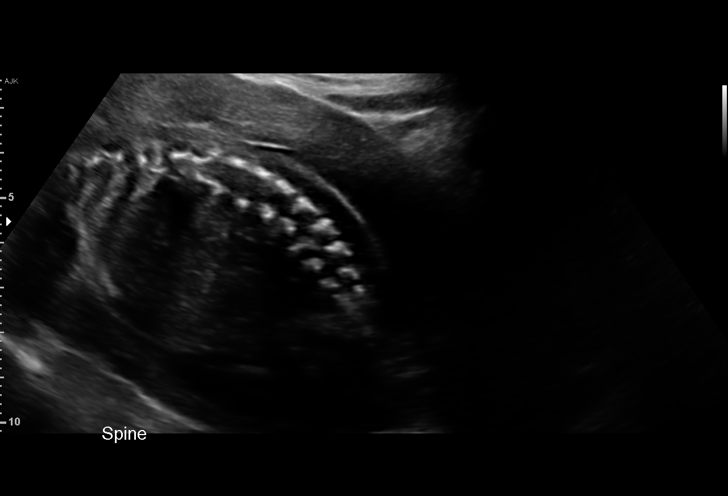

[14 of 28 positions shown; findings below may reference images not displayed]

#101

1  BARTON AISPURO            [id number]      [id number]     [id number]
Indications

22 weeks gestation of pregnancy
Advanced maternal age multigravida 35+,
second trimester
Obesity complicating pregnancy, second
trimester (low risk panorama)
Encounter for fetal anatomic survey
OB History

Blood Type:            Height:  5'10"  Weight (lb):  233      BMI:
Gravidity:    2         Term:   0        Prem:   0        SAB:   0
TOP:          1       Ectopic:  0        Living: 0
Fetal Evaluation

Num Of Fetuses:     1
Fetal Heart         150
Rate(bpm):
Cardiac Activity:   Observed
Presentation:       Breech
Placenta:           Posterior, above cervical os
P. Cord Insertion:  Visualized, central

Amniotic Fluid
AFI FV:      Subjectively within normal limits

Largest Pocket(cm)
3.93
Biometry

BPD:      50.2  mm     G. Age:  21w 1d          7  %    CI:        66.74   %   70 - 86
FL/HC:      20.3   %   19.2 -
HC:      196.9  mm     G. Age:  21w 6d         14  %    HC/AC:      1.08       1.05 -
AC:      181.9  mm     G. Age:  23w 0d         57  %    FL/BPD:     79.7   %   71 - 87
FL:         40  mm     G. Age:  22w 6d         51  %    FL/AC:      22.0   %   20 - 24

Est. FW:     534  gm      1 lb 3 oz     54  %
Gestational Age

LMP:           22w 4d       Date:   10/28/16                 EDD:   08/04/17
U/S Today:     22w 2d                                        EDD:   08/06/17
Best:          22w 4d    Det. By:   LMP  (10/28/16)          EDD:   08/04/17
Anatomy

Cranium:               Appears normal         Aortic Arch:            Appears normal
Cavum:                 Appears normal         Ductal Arch:            Not well visualized
Ventricles:            Appears normal         Diaphragm:              Appears normal
Choroid Plexus:        Appears normal         Stomach:                Appears normal, left
sided
Cerebellum:            Appears normal         Abdomen:                Appears normal
Posterior Fossa:       Appears normal         Abdominal Wall:         Appears nml (cord
insert, abd wall)
Nuchal Fold:           Not applicable (>20    Cord Vessels:           Appears normal (3
wks GA)                                        vessel cord)
Face:                  Appears normal         Kidneys:                Appear normal
(orbits and profile)
Lips:                  Appears normal         Bladder:                Appears normal
Thoracic:              Appears normal         Spine:                  Ltd views no
intracranial signs of
NT
Heart:                 Appears normal         Upper Extremities:      Appears normal
(4CH, axis, and situs
RVOT:                  Appears normal         Lower Extremities:      Appears normal
LVOT:                  Appears normal

Other:  Fetus appears to be a female. Heels visualized. Nasal bone
visualized.Technically difficult due to maternal habitus and fetal
position.
Cervix Uterus Adnexa

Cervix
Length:           3.49  cm.
Normal appearance by transabdominal scan.

Uterus
No abnormality visualized.

Left Ovary
Not visualized.

Right Ovary
Not visualized.

Adnexa:       No abnormality visualized.
Impression

Singleton intrauterine pregnancy at 22+4 weeks, here for
anatomic survey
Review of the anatomy shows no sonographic markers for
aneuploidy or structural anomalies
However, evaluations should be considered suboptimal
secondary to maternal habitus and fetal position
Amniotic fluid volume is normal
Estimated fetal weight is 534g which is growth in the 54th
percentile
Recommendations

All anatomy was visualized except aortic arch. A repeat scan
can be considered to visualize this, at your discretion.
Otherwise, follow-up ultrasounds as clinically indicated.

## 2021-02-05 ENCOUNTER — Encounter (HOSPITAL_COMMUNITY): Payer: Self-pay | Admitting: Emergency Medicine

## 2021-02-05 ENCOUNTER — Emergency Department (HOSPITAL_COMMUNITY)
Admission: EM | Admit: 2021-02-05 | Discharge: 2021-02-05 | Disposition: A | Discharge status: Home / Self Care | Payer: Medicaid Other | Attending: Emergency Medicine | Admitting: Emergency Medicine

## 2021-02-05 ENCOUNTER — Emergency Department (HOSPITAL_COMMUNITY): Payer: Medicaid Other

## 2021-02-05 DIAGNOSIS — R231 Pallor: Secondary | ICD-10-CM | POA: Diagnosis not present

## 2021-02-05 DIAGNOSIS — R42 Dizziness and giddiness: Secondary | ICD-10-CM | POA: Insufficient documentation

## 2021-02-05 DIAGNOSIS — R61 Generalized hyperhidrosis: Secondary | ICD-10-CM | POA: Diagnosis not present

## 2021-02-05 DIAGNOSIS — R072 Precordial pain: Secondary | ICD-10-CM | POA: Diagnosis present

## 2021-02-05 DIAGNOSIS — R0602 Shortness of breath: Secondary | ICD-10-CM | POA: Diagnosis not present

## 2021-02-05 LAB — BASIC METABOLIC PANEL
Anion gap: 6 (ref 5–15)
BUN: 11 mg/dL (ref 6–20)
CO2: 26 mmol/L (ref 22–32)
Calcium: 8.9 mg/dL (ref 8.9–10.3)
Chloride: 106 mmol/L (ref 98–111)
Creatinine, Ser: 0.87 mg/dL (ref 0.44–1.00)
GFR, Estimated: >60 mL/min (ref >60)
Glucose, Bld: 98 mg/dL (ref 70–99)
Potassium: 3.6 mmol/L (ref 3.5–5.1)
Sodium: 138 mmol/L (ref 135–145)

## 2021-02-05 LAB — CBC
HCT: 34.9 % — ABNORMAL LOW (ref 36.0–46.0)
Hemoglobin: 12.2 g/dL (ref 12.0–15.0)
MCH: 30 pg (ref 26.0–34.0)
MCHC: 35 g/dL (ref 30.0–36.0)
MCV: 85.7 fL (ref 80.0–100.0)
Platelets: 268 10*3/uL (ref 150–400)
RBC: 4.07 MIL/uL (ref 3.87–5.11)
RDW: 13.1 % (ref 11.5–15.5)
WBC: 7.7 10*3/uL (ref 4.0–10.5)
nRBC: 0 % (ref 0.0–0.2)

## 2021-02-05 LAB — I-STAT BETA HCG BLOOD, ED (MC, WL, AP ONLY): I-stat hCG, quantitative: <5 m[IU]/mL (ref <5)

## 2021-02-05 LAB — TROPONIN I (HIGH SENSITIVITY)
Troponin I (High Sensitivity): 18 ng/L — ABNORMAL HIGH (ref <18)
Troponin I (High Sensitivity): 20 ng/L — ABNORMAL HIGH (ref <18)

## 2021-02-05 MED ORDER — NITROGLYCERIN 0.4 MG SL SUBL: 0.4000 mg | SUBLINGUAL_TABLET | SUBLINGUAL | Status: DC | PRN

## 2021-02-05 MED ORDER — PANTOPRAZOLE SODIUM 40 MG IV SOLR
40.0000 mg | Freq: Once | INTRAVENOUS | Status: AC
  Administered 2021-02-05: 40 mg via INTRAVENOUS
  Filled 2021-02-05: qty 40

## 2021-02-05 MED ORDER — LIDOCAINE VISCOUS HCL 2 % MT SOLN
15.0000 mL | Freq: Once | OROMUCOSAL | Status: AC
Start: 2021-02-05 — End: 2021-02-05
  Administered 2021-02-05: 15 mL via ORAL
  Filled 2021-02-05: qty 15

## 2021-02-05 MED ORDER — PANTOPRAZOLE SODIUM 20 MG PO TBEC: 20.0000 mg | DELAYED_RELEASE_TABLET | Freq: Every day | ORAL | 0 refills | Status: DC

## 2021-02-05 MED ORDER — ALUM & MAG HYDROXIDE-SIMETH 400-400-40 MG/5ML PO SUSP: 10.0000 mL | Freq: Four times a day (QID) | ORAL | 0 refills | Status: DC | PRN

## 2021-02-05 MED ORDER — METHOCARBAMOL 500 MG PO TABS: 500.0000 mg | ORAL_TABLET | Freq: Three times a day (TID) | ORAL | 0 refills | Status: DC | PRN

## 2021-02-05 MED ORDER — ALUM & MAG HYDROXIDE-SIMETH 200-200-20 MG/5ML PO SUSP
30.0000 mL | Freq: Once | ORAL | Status: AC
  Administered 2021-02-05: 30 mL via ORAL
  Filled 2021-02-05: qty 30

## 2021-02-05 MED ORDER — METHOCARBAMOL 500 MG PO TABS
500.0000 mg | ORAL_TABLET | Freq: Once | ORAL | Status: AC
  Administered 2021-02-05: 500 mg via ORAL
  Filled 2021-02-05: qty 1

## 2021-02-05 NOTE — ED Provider Notes (Cosign Needed Addendum)
[facility] EMERGENCY DEPARTMENT Provider Note   CSN: [id number] Arrival date & time: 02/05/21  1352     History Chief Complaint  Patient presents with  . Chest Pain    Felicia Banks is a 40 y.o. female no past pertinent medical history.  Patient presents with chief complaint of chest pain.  Patient reports sudden onset at 1230 after walking up 3 small steps.  Chest pain is midsternal with no radiation.  Describes pain as a pressure.  Pain initially 10/10 on the pain scale.  Reports associated shortness of breath, clammy skin and lightheadedness.  Patient reports taking 324 mg of aspirin.  Patient states that her pain was improved after receiving 26mcg of fentanyl with EMS.  Patient states that she still feels pressure however is pain at present.    Patient denies any recent strenuous physical activity or heavy lifting.  Denies any relation of pain to eating states that she ate approximately 0900 this morning.   Patient denies any fevers, chills, URI symptoms, cough, palpitations, leg swelling, abdominal pain, dizziness, syncopal episode, headache, focal neurological deficit.  Patient denies any recent surgery in the last 12 weeks, history of PE or DVT, hormone therapy, hemoptysis, unilateral leg swelling or tenderness, cancer treatment, or prolonged immobilization.  Patient denies any alcohol, tobacco, or illicit drug use.  Patient states that she runs 4 to 7 miles per week.  Patient reports that she had a miscarriage in June 2021 and has not had a menstrual period since then.  Patient reports that she has seen her OB/GYN concerning this matter.    HPI    HPI: A 40 year old patient presents for evaluation of chest pain. Initial onset of pain was approximately 3-6 hours ago. The patient's chest pain is described as heaviness/pressure/tightness and is not worse with exertion. The patient reports some diaphoresis. The patient's chest pain is middle- or left-sided, is  not well-localized, is not sharp and does not radiate to the arms/jaw/neck. The patient does not complain of nausea. The patient has a family history of coronary artery disease in a first-degree relative with onset less than age 49. The patient has no history of stroke, has no history of peripheral artery disease, has not smoked in the past 90 days, denies any history of treated diabetes, is not hypertensive, has no history of hypercholesterolemia and does not have an elevated BMI (>=30).   Past Medical History:  Diagnosis Date  . HSV (herpes simplex virus) anogenital infection     Patient Active Problem List   Diagnosis Date Noted  . Term pregnancy 08/02/2017  . SVD (spontaneous vaginal delivery) 08/02/2017  . Postpartum care following vaginal delivery 08/02/2017    Past Surgical History:  Procedure Laterality Date  . ANKLE SURGERY     left  . INDUCED ABORTION       OB History    Gravida  2   Para  1   Term  1   Preterm      AB  1   Living        SAB      IAB  1   Ectopic      Multiple  0   Live Births              No family history on file.  Social History   Tobacco Use  . Smoking status: Never Smoker  . Smokeless tobacco: Never Used    Home Medications Prior to Admission medications  Medication Sig Start Date End Date Taking? Authorizing Provider  ibuprofen (ADVIL,MOTRIN) 600 MG tablet Take 1 tablet (600 mg total) by mouth every 6 (six) hours. 08/04/17   Meisinger, Sherren Mocha, MD  Prenatal Vit-Fe Fumarate-FA (PRENATAL MULTIVITAMIN) TABS tablet Take 1 tablet by mouth daily at 12 noon.    [provider]  valACYclovir (VALTREX) 500 MG tablet  07/11/17   [provider]    Allergies    Patient has no known allergies.  Review of Systems   Review of Systems  Constitutional: Positive for diaphoresis. Negative for chills and fever.  HENT: Negative for congestion, rhinorrhea and sore throat.   Eyes: Negative for visual disturbance.   Respiratory: Positive for shortness of breath. Negative for cough.   Cardiovascular: Positive for chest pain. Negative for palpitations and leg swelling.  Gastrointestinal: Negative for abdominal pain, constipation, diarrhea, nausea and vomiting.  Genitourinary: Negative for difficulty urinating, dysuria, frequency and hematuria.  Musculoskeletal: Negative for back pain and neck pain.  Skin: Negative for color change and rash.  Neurological: Positive for light-headedness. Negative for dizziness, tremors, seizures, syncope, facial asymmetry, speech difficulty, weakness, numbness and headaches.  Psychiatric/Behavioral: Negative for confusion.    Physical Exam Updated Vital Signs BP 127/71 (BP Location: Left Arm)   Pulse 74   Temp 99.3 F (37.4 C) (Oral)   Resp 16   SpO2 100%   Physical Exam Vitals and nursing note reviewed.  Constitutional:      General: She is not in acute distress.    Appearance: She is not ill-appearing, toxic-appearing or diaphoretic.  HENT:     Head: Normocephalic.  Eyes:     General: No scleral icterus.       Right eye: No discharge.        Left eye: No discharge.  Neck:     Vascular: No JVD.  Cardiovascular:     Rate and Rhythm: Normal rate.     Pulses:          Radial pulses are 3+ on the right side and 3+ on the left side.     Heart sounds: Normal heart sounds.  Pulmonary:     Effort: Pulmonary effort is normal. No tachypnea, bradypnea or respiratory distress.     Breath sounds: Normal breath sounds. No stridor.     Comments: Patient able to speak in full complete sentences without difficulty. Chest:     Chest wall: Tenderness present. No mass, lacerations, deformity, swelling, crepitus or edema.     Comments: Tenderness to mid sternum Abdominal:     General: There is no distension. There are no signs of injury.     Palpations: Abdomen is soft. There is no mass or pulsatile mass.     Tenderness: There is no abdominal tenderness. There is no  guarding or rebound.     Hernia: There is no hernia in the umbilical area or ventral area.  Musculoskeletal:     Cervical back: Neck supple.     Right lower leg: No swelling, deformity, lacerations, tenderness or bony tenderness. No edema.     Left lower leg: No swelling, deformity, lacerations, tenderness or bony tenderness. No edema.  Skin:    General: Skin is warm and dry.  Neurological:     General: No focal deficit present.     Mental Status: She is alert.     GCS: GCS eye subscore is 4. GCS verbal subscore is 5. GCS motor subscore is 6.  Psychiatric:  Mood and Affect: Mood is anxious.        Behavior: Behavior is cooperative.     ED Results / Procedures / Treatments   Labs (all labs ordered are listed, but only abnormal results are displayed) Labs Reviewed  CBC - Abnormal; Notable for the following components:      Result Value   HCT 34.9 (*)    All other components within normal limits  TROPONIN I (HIGH SENSITIVITY) - Abnormal; Notable for the following components:   Troponin I (High Sensitivity) 18 (*)    All other components within normal limits  TROPONIN I (HIGH SENSITIVITY) - Abnormal; Notable for the following components:   Troponin I (High Sensitivity) 20 (*)    All other components within normal limits  BASIC METABOLIC PANEL  I-STAT BETA HCG BLOOD, ED ([facility], [facility], [facility] ONLY)    EKG EKG Interpretation  Date/Time:  Sunday February 05 2021 14:09:02 EDT Ventricular Rate:  78 PR Interval:  124 QRS Duration: 82 QT Interval:  386 QTC Calculation: 440 R Axis:   33 Text Interpretation: Normal sinus rhythm Normal ECG agree, no old comparison Confirmed by Charlesetta Shanks [phone]) on 02/05/2021 5:43:56 PM   Radiology DG Chest 2 View  Result Date: 02/05/2021 CLINICAL DATA:  Midsternal chest pain. EXAM: CHEST - 2 VIEW COMPARISON:  None. FINDINGS: The cardiomediastinal contours are within normal limits. The lungs are clear. No pneumothorax or pleural effusion. No acute  finding in the visualized skeleton. IMPRESSION: No active cardiopulmonary disease. Electronically Signed   By: Audie Pinto M.D.   On: 02/05/2021 15:00    Procedures Procedures   Medications Ordered in ED Medications  alum & mag hydroxide-simeth (MAALOX/MYLANTA) 200-200-20 MG/5ML suspension 30 mL (30 mLs Oral Given 02/05/21 1942)    And  lidocaine (XYLOCAINE) 2 % viscous mouth solution 15 mL (15 mLs Oral Given 02/05/21 1942)  pantoprazole (PROTONIX) injection 40 mg (40 mg Intravenous Given 02/05/21 1947)  methocarbamol (ROBAXIN) tablet 500 mg (500 mg Oral Given 02/05/21 1942)    ED Course  I have reviewed the triage vital signs and the nursing notes.  Pertinent labs & imaging results that were available during my care of the patient were reviewed by me and considered in my medical decision making (see chart for details).    MDM Rules/Calculators/A&P HEAR Score: 2                        Alert 40 year old female in no acute distress, nontoxic appearing, patient appears anxious.  Presents with chief complaint of sudden onset midsternal chest pain with associated shortness of breath, lightheadedness, and clammy skin.  Patient describes her pain as a pressure.  Pain did not radiate.  Took 324 mg aspirin.  Patient reports pain was improved after receiving 50 mcg of fentanyl from EMS.  Patient denies any pain at present however still feels pressure to mid sternum.  Reactive auscultation S1-S2 present with no murmur rubs or gallops, +3 radial pulses bilaterally.  Lungs clear to auscultation bilaterally.  Patient able to speak in full complete sentences without difficulty.  Abdomen soft, nondistended, nontender.  No swelling or tenderness to lower extremities.  Low suspicion for PE as patient scores 0 on PERC rule for PE.  EKG shows normal sinus rhythm.  Chest x-ray shows no acute cardiopulmonary abnormality.  Initial troponin 18.  Will check second troponin.  Repeat troponin 20 with delta of  +2.  Low suspicion for ACS at this time.  Patient was given Protonix, GI cocktail, and Robaxin.  Patient was given prescription for the same medications.  Patient will follow up with her primary care provider as well as cardiology.  Patient is agreeable with this plan.  Discussed results, findings, treatment and follow up. Patient advised of return precautions. Patient verbalized understanding and agreed with plan.  Patient care and treatment were discussed with Dr. Vallery Ridge.  Final Clinical Impression(s) / ED Diagnoses Final diagnoses:  Precordial chest pain    Rx / DC Orders ED Discharge Orders         Ordered    methocarbamol (ROBAXIN) 500 MG tablet  Every 8 hours PRN        02/05/21 1936    pantoprazole (PROTONIX) 20 MG tablet  Daily        02/05/21 1936    alum & mag hydroxide-simeth [facility] MAXIMUM STRENGTH) 400-400-40 MG/5ML suspension  Every 6 hours PRN        02/05/21 1936           Loni Beckwith, PA-C 02/06/21 0000    Loni Beckwith, PA-C 02/06/21 0002    Charlesetta Shanks, MD 02/09/21 [phone]

## 2021-02-05 NOTE — ED Notes (Signed)
The pt is c/o mid-chest pain since this am some sob and she almost passed out  She also has some lt arm pain   No leg pain  No long car or air trips  No previous history  Alert no distress

## 2021-02-05 NOTE — Discharge Instructions (Addendum)
You came to the emergency department today to be evaluated for chest pain.  Your EKG, and lab work showed no signs of an acute heart attack.  Your chest x-ray showed no problems with your heart or lungs.  Your pain may have been related to a musculoskeletal issue or GI related issue.  I have given you prescription for Robaxin, Mylanta and Protonix.  Please take these medications as prescribed.  Please follow-up with your primary care provider and the cardiologist office provided on the paperwork. Today you were prescribed Methocarbamol (Robaxin).  Methocarbamol (Robaxin) is used to treat muscle spasms/pain.  It works by helping to relax the muscles.  Drowsiness, dizziness, lightheadedness, stomach upset, nausea/vomiting, or blurred vision may occur.  Do not drive, use machinery, or do anything that needs alertness or clear vision until you can do it safely.  Do not combine this medication with alcoholic beverages, marijuana, or other central nervous system depressants.     Get help right away if: Your chest pain gets worse. You have a cough that gets worse, or you cough up blood. You have severe pain in your abdomen. You faint. You have sudden, unexplained chest discomfort. You have sudden, unexplained discomfort in your arms, back, neck, or jaw. You have shortness of breath at any time. You suddenly start to sweat, or your skin gets clammy. You feel nausea or you vomit. You suddenly feel lightheaded or dizzy. You have severe weakness, or unexplained weakness or fatigue. Your heart begins to beat quickly, or it feels like it is skipping beats.

## 2021-02-05 NOTE — ED Triage Notes (Addendum)
Pt to triage via [facility].  Reports mid-sternal chest pain that started after walking up 3 steps.  Felt like she couldn't breathe and like she was going to pass out.  Went to fire station and was unable to get anyone so she broke the window trying to get in.  Left from fire dept and EMS was called to side of road.  Pt initially not talking to EMS.  20g L hand.  Fentanyl 50 mcg.  Denies any other symptoms.  Sharp pain relieved after Fentanyl but states she is still having pressure.

## 2021-02-08 ENCOUNTER — Ambulatory Visit (INDEPENDENT_AMBULATORY_CARE_PROVIDER_SITE_OTHER): Payer: Medicaid Other | Admitting: Cardiovascular Disease

## 2021-02-08 ENCOUNTER — Encounter: Payer: Self-pay | Admitting: Cardiovascular Disease

## 2021-02-08 ENCOUNTER — Other Ambulatory Visit: Payer: Self-pay

## 2021-02-08 VITALS — BP 124/72 | HR 72 | Ht 69.0 in | Wt 189.2 lb

## 2021-02-08 DIAGNOSIS — M940 Chondrocostal junction syndrome [Tietze]: Secondary | ICD-10-CM | POA: Diagnosis not present

## 2021-02-08 DIAGNOSIS — R079 Chest pain, unspecified: Secondary | ICD-10-CM

## 2021-02-08 MED ORDER — IBUPROFEN 800 MG PO TABS: 800.0000 mg | ORAL_TABLET | Freq: Three times a day (TID) | ORAL | 0 refills | Status: DC

## 2021-02-08 NOTE — Patient Instructions (Addendum)
Medication Instructions:  Start Ibuprofen 800 mg three times daily for 7 days. (please drink plenty of water with this)  *If you need a refill on your cardiac medications before your next appointment, please call your pharmacy*   Lab Work: TSH, ESR, CRP today  If you have labs (blood work) drawn today and your tests are completely normal, you will receive your results only by: Marland Kitchen MyChart Message (if you have MyChart) OR . A paper copy in the mail If you have any lab test that is abnormal or we need to change your treatment, we will call you to review the results.   Testing/Procedures: Echocardiogram - Your physician has requested that you have an echocardiogram. Echocardiography is a painless test that uses sound waves to create images of your heart. It provides your doctor with information about the size and shape of your heart and how well your heart's chambers and valves are working. This procedure takes approximately one hour. There are no restrictions for this procedure. This will be performed at our [facility] location - [address].    Follow-Up: At [facility], you and your health needs are our priority.  As part of our continuing mission to provide you with exceptional heart care, we have created designated Provider Care Teams.  These Care Teams include your primary Cardiologist (physician) and Advanced Practice Providers (APPs -  Physician Assistants and Nurse Practitioners) who all work together to provide you with the care you need, when you need it.  We recommend signing up for the patient portal called "MyChart".  Sign up information is provided on this After Visit Summary.  MyChart is used to connect with patients for Virtual Visits (Telemedicine).  Patients are able to view lab/test results, encounter notes, upcoming appointments, etc.  Non-urgent messages can be sent to your provider as well.   To learn more about what you can do with MyChart, go to  [email or web address].    Your next appointment:   As needed  The format for your next appointment:   In Person  Provider:   Eleonore Chiquito, MD

## 2021-02-08 NOTE — Progress Notes (Signed)
Cardiology Office Note:   Date:  02/08/2021  NAME:  Felicia Banks    MRN: [id number] DOB:  1981-01-04   PCP:  Patient, No Pcp Per  Cardiologist:  No primary care provider on file.  Electrophysiologist:  None   Referring MD: Charlesetta Shanks, MD   Chief Complaint  Patient presents with  . Chest Pain    History of Present Illness:   Felicia Banks is a 40 y.o. female with a hx of HSV who is being seen today for the evaluation of chest pain at the request of Charlesetta Shanks, MD. Seen in ER for CP 02/05/2021. Workup negative for ACS.  She reports she was seen in the ER on Sunday.  Apparently around 12:30 PM she developed sudden onset chest pain.  She reports she had sharp pain in center of her chest.  The pain was worse with deep breathing.  She reports the pain was worse with laying down.  It was slightly better with leaning forward.  She was then taken to the emergency room.  She apparently ruled out for an acute coronary syndrome.  Her troponins were normal in my opinion.  Initial value 18 and 20 on repeat.  She had no elevated white blood cell count.  Chest x-ray was normal.  Her EKG showed sinus rhythm with no evidence of ST-T changes.  She reports she is rather active.  She does a lot of running.  She never has chest pain or shortness of breath.  She reports since her diagnosis her visit to the emergency room she has been tired and fatigued.  She is exquisitely tender when I press on her center of her chest.  Symptoms are most consistent with costochondritis.  She does need a full panel of labs including TSH, CRP and ESR.  I have also recommended an echocardiogram.  She reports that her mother had heart troubles.  She had sickle cell anemia, diabetes and hypertension.  Her father has no medical problems.  She is a never smoker.  She does not drink alcohol or use drugs.  She reports that she routinely exercises running several miles without any limitations.  No new medications per her report.  No  association with food.  Of note, she was given a muscle relaxer in the emergency room.  Her symptoms did improve very much with the muscle relaxer.  Past Medical History: Past Medical History:  Diagnosis Date  . HSV (herpes simplex virus) anogenital infection     Past Surgical History: Past Surgical History:  Procedure Laterality Date  . ANKLE SURGERY     left  . INDUCED ABORTION      Current Medications: Current Meds  Medication Sig  . alum & mag hydroxide-simeth (MYLANTA MAXIMUM STRENGTH) 400-400-40 MG/5ML suspension Take 10 mLs by mouth every 6 (six) hours as needed for indigestion.  Marland Kitchen ibuprofen (ADVIL) 800 MG tablet Take 1 tablet (800 mg total) by mouth 3 (three) times daily.  . methocarbamol (ROBAXIN) 500 MG tablet Take 1 tablet (500 mg total) by mouth every 8 (eight) hours as needed for muscle spasms.  . pantoprazole (PROTONIX) 20 MG tablet Take 1 tablet (20 mg total) by mouth daily.  . Prenatal Vit-Fe Fumarate-FA (PRENATAL MULTIVITAMIN) TABS tablet Take 1 tablet by mouth daily at 12 noon.  . [DISCONTINUED] ibuprofen (ADVIL,MOTRIN) 600 MG tablet Take 1 tablet (600 mg total) by mouth every 6 (six) hours.     Allergies:    Patient has no known allergies.   Social  History: Social History   Socioeconomic History  . Marital status: Married    Spouse name: Not on file  . Number of children: 1  . Years of education: Not on file  . Highest education level: Not on file  Occupational History  . Occupation: insurance  Tobacco Use  . Smoking status: Never Smoker  . Smokeless tobacco: Never Used  Substance and Sexual Activity  . Alcohol use: Yes  . Drug use: Never  . Sexual activity: Not on file  Other Topics Concern  . Not on file  Social History Narrative  . Not on file   Social Determinants of Health   Financial Resource Strain: Not on file  Food Insecurity: Not on file  Transportation Needs: Not on file  Physical Activity: Not on file  Stress: Not on file   Social Connections: Not on file     Family History: The patient's family history includes Diabetes in her mother; Heart attack in her mother.  ROS:   All other ROS reviewed and negative. Pertinent positives noted in the HPI.     EKGs/Labs/Other Studies Reviewed:   The following studies were personally reviewed by me today:  EKG:  EKG is not ordered today.  EKG reviewed from the emergency room on 02/05/2021 demonstrates normal sinus rhythm heart rate 78 with no acute ischemic changes or evidence of infarction, and was personally reviewed by me.   Recent Labs: 02/05/2021: BUN 11; Creatinine, Ser 0.87; Hemoglobin 12.2; Platelets 268; Potassium 3.6; Sodium 138   Recent Lipid Panel No results found for: CHOL, TRIG, HDL, CHOLHDL, VLDL, LDLCALC, LDLDIRECT  Physical Exam:   VS:  BP 124/72   Pulse 72   Ht '5\' 9"'  (1.753 m)   Wt 189 lb 3.2 oz (85.8 kg)   SpO2 99%   BMI 27.94 kg/m    Wt Readings from Last 3 Encounters:  02/08/21 189 lb 3.2 oz (85.8 kg)  08/02/17 247 lb (112 kg)    General: Well nourished, well developed, in no acute distress Head: Atraumatic, normal size  Eyes: PEERLA, EOMI  Neck: Supple, no JVD Endocrine: No thryomegaly Cardiac: Normal S1, S2; RRR; no murmurs, rubs, or gallops Lungs: Clear to auscultation bilaterally, no wheezing, rhonchi or rales  Abd: Soft, nontender, no hepatomegaly  Ext: No edema, pulses 2+ Musculoskeletal: No deformities, BUE and BLE strength normal and equal, exquisite tenderness to palpation over the left chest wall Skin: Warm and dry, no rashes   Neuro: Alert and oriented to person, place, time, and situation, CNII-XII grossly intact, no focal deficits  Psych: Normal mood and affect   ASSESSMENT:   Felicia Banks is a 40 y.o. female who presents for the following: 1. Chest pain, unspecified type   2. Costochondritis     PLAN:   1. Chest pain, unspecified type 2. Costochondritis -Atypical sharp chest pain.  EKG normal.  Troponin 18  and 20 on repeat.  Her symptoms are improved with a muscle relaxer.  She exquisitely tender over the left chest wall.  I highly suspect this is costochondritis.  Symptoms are not concerning for pericarditis but we will check an ESR and CRP.  I would also like to check a TSH.  Her troponins were at the upper limit of normal.  I think an echocardiogram would be a good idea.  We will make sure nothing else is going on.  She really has no risk factors for coronary heart disease.  I suspect this is all musculoskeletal in nature and  will improve with short course of NSAIDs.  She will take 800 mg of ibuprofen 3 times a day.  I will see her back as needed.  She will let us know if symptoms do not improve.   Disposition: No follow-ups on file.  Medication Adjustments/Labs and Tests Ordered: Current medicines are reviewed at length with the patient today.  Concerns regarding medicines are outlined above.  Orders Placed This Encounter  Procedures  . TSH  . C-reactive protein  . Sed Rate (ESR)  . EKG 12-Lead  . ECHOCARDIOGRAM COMPLETE   Meds ordered this encounter  Medications  . ibuprofen (ADVIL) 800 MG tablet    Sig: Take 1 tablet (800 mg total) by mouth 3 (three) times daily.    Dispense:  21 tablet    Refill:  0    Patient Instructions  Medication Instructions:  Start Ibuprofen 800 mg three times daily for 7 days. (please drink plenty of water with this)  *If you need a refill on your cardiac medications before your next appointment, please call your pharmacy*   Lab Work: TSH, ESR, CRP today  If you have labs (blood work) drawn today and your tests are completely normal, you will receive your results only by: Marland Kitchen MyChart Message (if you have MyChart) OR . A paper copy in the mail If you have any lab test that is abnormal or we need to change your treatment, we will call you to review the results.   Testing/Procedures: Echocardiogram - Your physician has requested that you have an  echocardiogram. Echocardiography is a painless test that uses sound waves to create images of your heart. It provides your doctor with information about the size and shape of your heart and how well your heart's chambers and valves are working. This procedure takes approximately one hour. There are no restrictions for this procedure. This will be performed at our [facility] location - [address], [address].    Follow-Up: At [facility], you and your health needs are our priority.  As part of our continuing mission to provide you with exceptional heart care, we have created designated Provider Care Teams.  These Care Teams include your primary Cardiologist (physician) and Advanced Practice Providers (APPs -  Physician Assistants and Nurse Practitioners) who all work together to provide you with the care you need, when you need it.  We recommend signing up for the patient portal called "MyChart".  Sign up information is provided on this After Visit Summary.  MyChart is used to connect with patients for Virtual Visits (Telemedicine).  Patients are able to view lab/test results, encounter notes, upcoming appointments, etc.  Non-urgent messages can be sent to your provider as well.   To learn more about what you can do with MyChart, go to [email or web address].    Your next appointment:   As needed  The format for your next appointment:   In Person  Provider:   Eleonore Chiquito, MD        Signed, Addison Naegeli. Audie Box, MD, [facility]  [address], [place], [place] [address] [phone]  02/08/2021 3:46 PM

## 2021-02-09 LAB — TSH: TSH: 1.01 u[IU]/mL (ref 0.450–4.500)

## 2021-02-09 LAB — C-REACTIVE PROTEIN: CRP: 11 mg/L — ABNORMAL HIGH (ref 0–10)

## 2021-02-09 LAB — SEDIMENTATION RATE: Sed Rate: 11 mm/hr (ref 0–32)

## 2021-02-10 ENCOUNTER — Ambulatory Visit (HOSPITAL_COMMUNITY): Payer: Medicaid Other | Attending: Cardiology

## 2021-02-10 ENCOUNTER — Other Ambulatory Visit: Payer: Self-pay

## 2021-02-10 DIAGNOSIS — R079 Chest pain, unspecified: Secondary | ICD-10-CM

## 2021-02-10 LAB — ECHOCARDIOGRAM COMPLETE
Area-P 1/2: 4.06 cm2
S' Lateral: 2.6 cm

## 2021-02-20 ENCOUNTER — Encounter (HOSPITAL_COMMUNITY): Payer: Self-pay | Admitting: Anesthesiology

## 2021-02-20 NOTE — Anesthesia Preprocedure Evaluation (Deleted)
Anesthesia Evaluation Anesthesia Physical Anesthesia Plan  ASA:   Anesthesia Plan:    Post-op Pain Management:    Induction:   PONV Risk Score and Plan:   Airway Management Planned:   Additional Equipment:   Intra-op Plan:   Post-operative Plan:   Informed Consent:   Plan Discussed with:   Anesthesia Plan Comments: (Called by Dr. Terri Piedra to review patient history.  Recently seen by Cardiology for  the evaluation of chest pain at the request of Charlesetta Shanks, MD. Seen in ER for CP 02/05/2021. Workup negative for ACS.  She reports she was seen in the ER on Sunday.  Apparently around 12:30 PM she developed sudden onset chest pain.  She reports she had sharp pain in center of her chest.  The pain was worse with deep breathing.  She reports the pain was worse with laying down.  It was slightly better with leaning forward.  She was then taken to the emergency room.  She apparently ruled out for an acute coronary syndrome.  Her troponins were normal in my opinion.  Initial value 18 and 20 on repeat.  She had no elevated white blood cell count.  Chest x-ray was normal.  Her EKG showed sinus rhythm with no evidence of ST-T changes.  She reports she is rather active.  She does a lot of running.  She never has chest pain or shortness of breath.  She reports since her diagnosis her visit to the emergency room she has been tired and fatigued.  She is exquisitely tender when I press on her center of her chest.  Symptoms are most consistent with costochondritis.   ECHO = Left Ventricle: Left ventricular ejection fraction, by estimation, is 50  to 55%. The left ventricle has low normal function. The left ventricle has  no regional wall motion abnormalities. The left ventricular internal  cavity size was normal in size.  There is no left ventricular hypertrophy. Left ventricular diastolic  parameters were normal.   EJ Lakrisha Iseman MD)       Anesthesia Quick Evaluation

## 2021-02-22 ENCOUNTER — Other Ambulatory Visit: Payer: Self-pay | Admitting: *Deleted

## 2021-02-22 MED ORDER — IBUPROFEN 800 MG PO TABS: 800.0000 mg | ORAL_TABLET | Freq: Three times a day (TID) | ORAL | 0 refills | Status: DC

## 2021-02-22 NOTE — Telephone Encounter (Signed)
Received a call from pt on the refill line.  She stated that she was still feeing tightness in her chest, and still thinks she has some inflammation and is requesting a refill for the Ibuprofen 800 mg.

## 2021-02-22 NOTE — Telephone Encounter (Signed)
Please advise. Thanks.  

## 2021-02-22 NOTE — Telephone Encounter (Signed)
That is fine. She needs to drink plenty of water with this. She should only do 7 days and stop.   Lake Bells T. Audie Box, MD, [place]  [address] [place], [place] [address] [phone]  11:56 AM

## 2021-02-27 ENCOUNTER — Ambulatory Visit: Payer: Medicaid Other | Admitting: Cardiovascular Disease

## 2021-02-28 ENCOUNTER — Other Ambulatory Visit (HOSPITAL_COMMUNITY): Payer: Medicaid Other

## 2021-03-02 ENCOUNTER — Ambulatory Visit (HOSPITAL_BASED_OUTPATIENT_CLINIC_OR_DEPARTMENT_OTHER)
Admission: RE | Admit: 2021-03-02 | Payer: Medicaid Other | Source: Home / Self Care | Admitting: Obstetrics and Gynecology

## 2021-03-02 ENCOUNTER — Encounter (HOSPITAL_BASED_OUTPATIENT_CLINIC_OR_DEPARTMENT_OTHER): Admission: RE | Payer: Self-pay | Source: Home / Self Care

## 2021-03-02 SURGERY — DILATATION & CURETTAGE/HYSTEROSCOPY WITH MYOSURE
Anesthesia: General

## 2021-03-13 ENCOUNTER — Other Ambulatory Visit (HOSPITAL_COMMUNITY): Payer: Medicaid Other

## 2021-03-14 ENCOUNTER — Ambulatory Visit (HOSPITAL_COMMUNITY): Payer: Medicaid Other | Attending: Physician Assistant

## 2021-03-14 ENCOUNTER — Other Ambulatory Visit: Payer: Self-pay

## 2021-03-14 DIAGNOSIS — R0789 Other chest pain: Secondary | ICD-10-CM | POA: Diagnosis not present

## 2021-03-14 DIAGNOSIS — R0600 Dyspnea, unspecified: Secondary | ICD-10-CM | POA: Diagnosis not present

## 2021-03-14 DIAGNOSIS — R079 Chest pain, unspecified: Secondary | ICD-10-CM

## 2021-03-14 DIAGNOSIS — R0609 Other forms of dyspnea: Secondary | ICD-10-CM

## 2021-04-06 ENCOUNTER — Telehealth: Payer: Self-pay

## 2021-04-06 NOTE — Telephone Encounter (Signed)
   [facility] Pre-operative Risk Assessment    Patient Name: Felicia Banks  DOB: [birth year]  MRN: [id number]  Request for surgical clearance:  1. What type of surgery is being performed? HYSTEROSCOPY W/MYOSURE   2. When is this surgery scheduled? TBD   3. What type of clearance is required (medical clearance vs. Pharmacy clearance to hold med vs. Both)? MEDICAL  4. Are there any medications that need to be held prior to surgery and how long?NONE LISTED   5. Practice name and name of physician performing surgery? [facility], DO ATTN:HANNAH 6. What is the office phone number? [phone]   7.   What is the office fax number? [phone] 8.   Anesthesia type (None, local, MAC, general) ? NONE LISTED

## 2021-04-06 NOTE — Telephone Encounter (Signed)
   Primary Cardiologist: Evalina Field, MD  Chart reviewed as part of pre-operative protocol coverage. Given past medical history and time since last visit, based on ACC/AHA guidelines, Sheresa Weitz would be at acceptable risk for the planned procedure without further cardiovascular testing.   I will route this recommendation to the requesting party via Epic fax function and remove from pre-op pool.  Please call with questions.  Jossie Ng. Capricia Serda NP-C    04/06/2021, 5:48 PM [place] [address] Office [phone] Fax [phone]

## 2021-04-19 DIAGNOSIS — U071 COVID-19: Secondary | ICD-10-CM

## 2021-04-19 HISTORY — DX: COVID-19: U07.1

## 2021-05-16 NOTE — H&P (Signed)
Felicia Banks is an 2 [id number] female who is presenting for hysteroscopy D&C with myosure for endometrial polyps and recurrent pregnancy loss. Pt had 2 pregnancy losses in 2021. She opted for workup prior to next pregnancy and on SIUS endometrial polyps and 3 intramural fibroids were noted. Largest polyp measured 1cm Pt was also recently diagnosed with myocarditis. She was worked up by both cardiologist and PCP. EF noted to be 50-55% . Stress test done  Pertinent Gynecological History: Menses: flow is moderate Bleeding: regular  Contraception: none DES exposure: denies Blood transfusions: none Sexually transmitted diseases: no past history Previous GYN Procedures: DNC  Last mammogram:  n/a  Date:  Last pap: normal Date: 2016 OB History: G4, P1031   Menstrual History: Menarche age: 82 No LMP recorded.    Past Medical History:  Diagnosis Date   HSV (herpes simplex virus) anogenital infection     Past Surgical History:  Procedure Laterality Date   ANKLE SURGERY     left   INDUCED ABORTION      Family History  Problem Relation Age of Onset   Heart attack Mother    Diabetes Mother     Social History:  reports that she has never smoked. She has never used smokeless tobacco. She reports current alcohol use. She reports that she does not use drugs.  Allergies: No Known Allergies  No medications prior to admission.    Review of Systems  Constitutional:  Negative for activity change, appetite change, diaphoresis and fatigue.  Eyes:  Negative for visual disturbance.  Respiratory:  Negative for choking and shortness of breath.   Cardiovascular:  Positive for chest pain. Negative for palpitations and leg swelling.  Gastrointestinal:  Negative for abdominal pain.  Genitourinary:  Negative for menstrual problem and pelvic pain.  Musculoskeletal:  Negative for back pain and myalgias.  Neurological:  Negative for light-headedness and headaches.  Psychiatric/Behavioral:  The  patient is nervous/anxious.   All other systems reviewed and are negative.  unknown if currently breastfeeding. Physical Exam Vitals and nursing note reviewed. Exam conducted with a chaperone present.  Constitutional:      Appearance: Normal appearance. She is normal weight.  Pulmonary:     Effort: Pulmonary effort is normal.  Abdominal:     Palpations: Abdomen is soft.  Genitourinary:    General: Normal vulva.  Musculoskeletal:        General: Normal range of motion.     Cervical back: Normal range of motion.  Skin:    General: Skin is warm and dry.  Neurological:     General: No focal deficit present.     Mental Status: She is alert and oriented to person, place, and time. Mental status is at baseline.  Psychiatric:        Mood and Affect: Mood normal.        Behavior: Behavior normal.        Thought Content: Thought content normal.        Judgment: Judgment normal.    No results found for this or any previous visit (from the past 24 hour(s)).  No results found.  Assessment/Plan: [id number] female here for hysteroscopy D&C with myosure due to endometrial polyps and recurrent pregnancy loss.  - Admit - ERAS protocol - Verify consent - Clearance by cardiologist and PCP  - To OR when ready   Venetia Night Jermiah Howton 05/16/2021, 2:51 AM

## 2021-05-26 ENCOUNTER — Other Ambulatory Visit: Payer: Self-pay

## 2021-05-26 ENCOUNTER — Encounter (HOSPITAL_BASED_OUTPATIENT_CLINIC_OR_DEPARTMENT_OTHER): Payer: Self-pay | Admitting: Obstetrics and Gynecology

## 2021-05-26 NOTE — Progress Notes (Signed)
Spoke w/ via phone for pre-op interview---PT Lab needs dos---- TYPE AND SCREEN URINE PREG              Lab results------SEE BELOW COVID test ----POS  RAPID COVID  HOME TEST 04/19/21 MENTIONED IN PCP OFFICE NOTE 04-21-2021 [facility] NOTE ON PATIENT CHART Arrive at -------4540 06/01/21 NPO after MN NO Solid Food.  Clear liquids from MN until---0715 AM Med rec completed  Medications to take morning of surgery -----NA Diabetic medication -----NA Patient instructed no nail polish to be worn day of surgery Patient instructed to bring photo id and insurance card day of surgery Patient aware to have Driver (ride ) / caregiver J.M. Lockie Mola    for 24 hours after surgery  Patient Special Instructions -----NA Pre-Op special Istructions -----NA Patient verbalized understanding of instructions that were given at this phone interview. Patient denies shortness of breath, chest pain, fever, cough at this phone interview.   CHART/Epic ECHO REPORT 02/10/21, CARDIOPULMONARY EXERCISE TEST 03/14/21, CARDIAC CLEARANCE 04/06/21 Evalina Field MD, EKG 02/05/21

## 2021-05-31 NOTE — Progress Notes (Signed)
Spoke with patient to notify of new surgery arrival time of 0730 on 06/01/2021.

## 2021-06-01 ENCOUNTER — Encounter (HOSPITAL_BASED_OUTPATIENT_CLINIC_OR_DEPARTMENT_OTHER): Payer: Self-pay | Admitting: Obstetrics and Gynecology

## 2021-06-01 ENCOUNTER — Other Ambulatory Visit: Payer: Self-pay

## 2021-06-01 ENCOUNTER — Ambulatory Visit (HOSPITAL_BASED_OUTPATIENT_CLINIC_OR_DEPARTMENT_OTHER)
Admission: RE | Admit: 2021-06-01 | Discharge: 2021-06-01 | Disposition: A | Discharge status: Home / Self Care | Payer: Medicaid Other | Attending: Obstetrics and Gynecology | Admitting: Obstetrics and Gynecology

## 2021-06-01 ENCOUNTER — Encounter (HOSPITAL_BASED_OUTPATIENT_CLINIC_OR_DEPARTMENT_OTHER)
Admission: RE | Disposition: A | Discharge status: Home / Self Care | Payer: Self-pay | Source: Home / Self Care | Attending: Obstetrics and Gynecology

## 2021-06-01 ENCOUNTER — Ambulatory Visit (HOSPITAL_BASED_OUTPATIENT_CLINIC_OR_DEPARTMENT_OTHER): Payer: Medicaid Other | Admitting: Anesthesiology

## 2021-06-01 DIAGNOSIS — Z8249 Family history of ischemic heart disease and other diseases of the circulatory system: Secondary | ICD-10-CM | POA: Insufficient documentation

## 2021-06-01 DIAGNOSIS — Z833 Family history of diabetes mellitus: Secondary | ICD-10-CM | POA: Diagnosis not present

## 2021-06-01 DIAGNOSIS — N96 Recurrent pregnancy loss: Secondary | ICD-10-CM | POA: Insufficient documentation

## 2021-06-01 DIAGNOSIS — N840 Polyp of corpus uteri: Secondary | ICD-10-CM

## 2021-06-01 DIAGNOSIS — Z791 Long term (current) use of non-steroidal anti-inflammatories (NSAID): Secondary | ICD-10-CM | POA: Insufficient documentation

## 2021-06-01 HISTORY — DX: Presence of spectacles and contact lenses: Z97.3

## 2021-06-01 HISTORY — PX: DILATATION & CURETTAGE/HYSTEROSCOPY WITH MYOSURE: SHX6511

## 2021-06-01 HISTORY — DX: Family history of other specified conditions: Z84.89

## 2021-06-01 LAB — TYPE AND SCREEN
ABO/RH(D): O POS
Antibody Screen: NEGATIVE

## 2021-06-01 LAB — POCT PREGNANCY, URINE: Preg Test, Ur: NEGATIVE

## 2021-06-01 SURGERY — DILATATION & CURETTAGE/HYSTEROSCOPY WITH MYOSURE
Anesthesia: General | Site: Vagina

## 2021-06-01 MED ORDER — ONDANSETRON HCL 4 MG/2ML IJ SOLN
INTRAMUSCULAR | Status: DC | PRN
  Administered 2021-06-01: 4 mg via INTRAVENOUS

## 2021-06-01 MED ORDER — MIDAZOLAM HCL 5 MG/5ML IJ SOLN
INTRAMUSCULAR | Status: DC | PRN
  Administered 2021-06-01: 2 mg via INTRAVENOUS

## 2021-06-01 MED ORDER — FENTANYL CITRATE (PF) 100 MCG/2ML IJ SOLN
INTRAMUSCULAR | Status: AC
  Filled 2021-06-01: qty 2

## 2021-06-01 MED ORDER — IBUPROFEN 600 MG PO TABS: 600.0000 mg | ORAL_TABLET | Freq: Four times a day (QID) | ORAL | 0 refills | Status: AC | PRN

## 2021-06-01 MED ORDER — LACTATED RINGERS IV SOLN: INTRAVENOUS | Status: DC

## 2021-06-01 MED ORDER — ONDANSETRON HCL 4 MG/2ML IJ SOLN
INTRAMUSCULAR | Status: AC
  Filled 2021-06-01: qty 2

## 2021-06-01 MED ORDER — DEXAMETHASONE SODIUM PHOSPHATE 10 MG/ML IJ SOLN
INTRAMUSCULAR | Status: DC | PRN
  Administered 2021-06-01: 8 mg via INTRAVENOUS

## 2021-06-01 MED ORDER — MIDAZOLAM HCL 2 MG/2ML IJ SOLN
INTRAMUSCULAR | Status: AC
  Filled 2021-06-01: qty 2

## 2021-06-01 MED ORDER — LIDOCAINE HCL 1 % IJ SOLN
INTRAMUSCULAR | Status: DC | PRN
  Administered 2021-06-01: 10 mL

## 2021-06-01 MED ORDER — FENTANYL CITRATE (PF) 100 MCG/2ML IJ SOLN
25.0000 ug | INTRAMUSCULAR | Status: DC | PRN
  Administered 2021-06-01 (×2): 50 ug via INTRAVENOUS

## 2021-06-01 MED ORDER — SILVER NITRATE-POT NITRATE 75-25 % EX MISC
CUTANEOUS | Status: DC | PRN
  Administered 2021-06-01: 2

## 2021-06-01 MED ORDER — KETOROLAC TROMETHAMINE 30 MG/ML IJ SOLN
INTRAMUSCULAR | Status: DC | PRN
  Administered 2021-06-01: 30 mg via INTRAVENOUS

## 2021-06-01 MED ORDER — SODIUM CHLORIDE 0.9 % IR SOLN
Status: DC | PRN
  Administered 2021-06-01: 3000 mL

## 2021-06-01 MED ORDER — ACETAMINOPHEN 500 MG PO TABS
ORAL_TABLET | ORAL | Status: AC
  Filled 2021-06-01: qty 2

## 2021-06-01 MED ORDER — ACETAMINOPHEN 500 MG PO TABS
1000.0000 mg | ORAL_TABLET | ORAL | Status: AC
  Administered 2021-06-01: 1000 mg via ORAL

## 2021-06-01 MED ORDER — KETOROLAC TROMETHAMINE 30 MG/ML IJ SOLN
INTRAMUSCULAR | Status: AC
  Filled 2021-06-01: qty 1

## 2021-06-01 MED ORDER — DEXAMETHASONE SODIUM PHOSPHATE 10 MG/ML IJ SOLN
INTRAMUSCULAR | Status: AC
  Filled 2021-06-01: qty 1

## 2021-06-01 MED ORDER — FENTANYL CITRATE (PF) 100 MCG/2ML IJ SOLN
INTRAMUSCULAR | Status: DC | PRN
  Administered 2021-06-01: 50 ug via INTRAVENOUS

## 2021-06-01 MED ORDER — POVIDONE-IODINE 10 % EX SWAB: 2.0000 "application " | Freq: Once | CUTANEOUS | Status: DC

## 2021-06-01 MED ORDER — ACETAMINOPHEN 500 MG PO TABS: 1000.0000 mg | ORAL_TABLET | Freq: Once | ORAL | Status: DC

## 2021-06-01 MED ORDER — PROPOFOL 10 MG/ML IV BOLUS
INTRAVENOUS | Status: DC | PRN
  Administered 2021-06-01: 200 mg via INTRAVENOUS

## 2021-06-01 MED ORDER — OXYCODONE-ACETAMINOPHEN 5-325 MG PO TABS: 1.0000 | ORAL_TABLET | ORAL | 0 refills | Status: AC | PRN

## 2021-06-01 MED ORDER — LIDOCAINE 2% (20 MG/ML) 5 ML SYRINGE
INTRAMUSCULAR | Status: DC | PRN
  Administered 2021-06-01: 60 mg via INTRAVENOUS

## 2021-06-01 MED ORDER — LIDOCAINE HCL (PF) 2 % IJ SOLN
INTRAMUSCULAR | Status: AC
  Filled 2021-06-01: qty 5

## 2021-06-01 SURGICAL SUPPLY — 16 items
CATH ROBINSON RED A/P 16FR (CATHETERS) ×2 IMPLANT
DEVICE MYOSURE REACH (MISCELLANEOUS) ×2 IMPLANT
GAUZE 4X4 16PLY ~~LOC~~+RFID DBL (SPONGE) ×2 IMPLANT
GLOVE SURG ENC MOIS LTX SZ6.5 (GLOVE) ×2 IMPLANT
GLOVE SURG POLYISO LF SZ6.5 (GLOVE) ×2 IMPLANT
GLOVE SURG UNDER POLY LF SZ6.5 (GLOVE) ×2 IMPLANT
GLOVE SURG UNDER POLY LF SZ7 (GLOVE) ×2 IMPLANT
GOWN STRL REUS W/TWL LRG LVL3 (GOWN DISPOSABLE) ×4 IMPLANT
IV NS IRRIG 3000ML ARTHROMATIC (IV SOLUTION) ×2 IMPLANT
KIT PROCEDURE FLUENT (KITS) ×2 IMPLANT
KIT TURNOVER CYSTO (KITS) ×2 IMPLANT
NEEDLE SPNL 18GX3.5 QUINCKE PK (NEEDLE) ×2 IMPLANT
PACK VAGINAL MINOR WOMEN LF (CUSTOM PROCEDURE TRAY) ×2 IMPLANT
PAD OB MATERNITY 4.3X12.25 (PERSONAL CARE ITEMS) ×2 IMPLANT
SEAL ROD LENS SCOPE MYOSURE (ABLATOR) ×2 IMPLANT
TOWEL OR 17X26 10 PK STRL BLUE (TOWEL DISPOSABLE) ×2 IMPLANT

## 2021-06-01 NOTE — Interval H&P Note (Signed)
History and Physical Interval Note: Pt reports no change since H/P done. She consents to hysteroscopy dilation and curettage and myosure resection of uterine polyps/fibroids Pt to OR when ready    06/01/2021 9:40 AM  Felicia Banks  has presented today for surgery, with the diagnosis of endometrial polyps.  The various methods of treatment have been discussed with the patient and family. After consideration of risks, benefits and other options for treatment, the patient has consented to  Procedure(s): [place] (N/A) as a surgical intervention.  The patient's history has been reviewed, patient examined, no change in status, stable for surgery.  I have reviewed the patient's chart and labs.  Questions were answered to the patient's satisfaction.     Isaiah Serge

## 2021-06-01 NOTE — Anesthesia Postprocedure Evaluation (Signed)
Anesthesia Post Note  Patient: Felicia Banks  Procedure(s) Performed: DILATATION & CURETTAGE/HYSTEROSCOPY WITH MYOSURE (Vagina )     Patient location during evaluation: PACU Anesthesia Type: General Level of consciousness: awake and alert Pain management: pain level controlled Vital Signs Assessment: post-procedure vital signs reviewed and stable Respiratory status: spontaneous breathing, nonlabored ventilation, respiratory function stable and patient connected to nasal cannula oxygen Cardiovascular status: blood pressure returned to baseline and stable Postop Assessment: no apparent nausea or vomiting Anesthetic complications: no   No notable events documented.  Last Vitals:  Vitals:   06/01/21 1100 06/01/21 1135  BP: 102/66 108/68  Pulse: 65 (!) 52  Resp: 20 15  Temp:  (!) 36.4 C  SpO2: 99% 100%    Last Pain:  Vitals:   06/01/21 1135  TempSrc:   PainSc: 4                  Annais Crafts L Baili Stang

## 2021-06-01 NOTE — Op Note (Signed)
Operative Note    Preoperative Diagnosis Endometrial polyps Recurrent miscarriage   Postoperative Diagnosis: Same   Procedure: Hysteroscopy with dilation and curettage and myosure resection of endometrial polyps   Surgeon: Mickle Mallory, DO   Anesthesia: General plus 1% lidocaine for paracervical block  Fluids: LR 866ml EBL: 11ml UOP:143ml Fluid deficit - 5ml  Findings: proliferative endometrium with endometrial polyps. Clear cavity post myosure. [place] in normal anatomic position   Specimen: endometrial polyps   Procedure Note   Patient was taken to the operating room.  She was placed in the dorsal supine position and anesthesia administered. She was then placed in the dorsal lithotomy position and prepped and draped in the normal sterile fashion. An appropriate time out was performed.   A weighted speculum was then placed within the posterior forchette of the vagina  with a lateral sims in the asperior aspect. The anterior lip of the cervix was identified and grasped with a single toothed tenaculum. 10ccs of 1% plain lidocaine was injected at 3 and 9 oclock for a paracervical block.   The uterus was then sounded to approximately 7 cm and the [facility] dilators utilized to dilate the cervix up to 19.  The Myosure operating scope was then introduced and the cavity inspected with findings as previously noted.  proliferative endometrium with endometrial polyps. Clear cavity post myosure. [place] in normal anatomic position  The Myosure reach operating blade was then introduced through the scope and under direct visualization the polyps were removed in their entirety. There was no active bleeding at the conclusion of the removal. The operating scope was then removed and a small curette inserted . A gentle curettage was performed in all 4 quadrants.   All tissue specimens were handed off to pathology.   The tenaculum was then removed from the cervix and the site rendered hemostatic with  silver nitrate.   Finally the speculum was removed from the vagina and the patient awakened and taken to the recovery room in stable condition.

## 2021-06-01 NOTE — Transfer of Care (Signed)
Immediate Anesthesia Transfer of Care Note  Patient: Felicia Banks  Procedure(s) Performed: DILATATION & CURETTAGE/HYSTEROSCOPY WITH MYOSURE (Vagina )  Patient Location: PACU  Anesthesia Type:General  Level of Consciousness: drowsy  Airway & Oxygen Therapy: Patient Spontanous Breathing and Patient connected to nasal cannula oxygen  Post-op Assessment: Report given to RN  Post vital signs: Reviewed and stable  Last Vitals:  Vitals Value Taken Time  BP 121/84 06/01/21 1027  Temp    Pulse 70 06/01/21 1028  Resp 12 06/01/21 1028  SpO2 100 % 06/01/21 1028  Vitals shown include unvalidated device data.  Last Pain:  Vitals:   06/01/21 0755  TempSrc: Oral  PainSc: 0-No pain      Patients Stated Pain Goal: 7 (68/25/74 9355)  Complications: No notable events documented.

## 2021-06-01 NOTE — Discharge Instructions (Addendum)
Call office with any concerns [phone]   DISCHARGE INSTRUCTIONS: D&C The following instructions have been prepared to help you care for yourself upon your return home.   Personal hygiene:  Use sanitary pads for vaginal drainage, not tampons.  Shower the day after your procedure.  NO tub baths, pools or Jacuzzis for 2-3 weeks.  Wipe front to back after using the bathroom.  Activity and limitations:  Do NOT drive or operate any equipment for 24 hours. The effects of anesthesia are still present and drowsiness may result.  Do NOT rest in bed all day.  Walking is encouraged.  Walk up and down stairs slowly.  You may resume your normal activity in one to two days or as indicated by your physician.  Sexual activity: NO intercourse for at least 2 weeks after the procedure, or as indicated by your physician.  Diet: Eat a light meal as desired this evening. You may resume your usual diet tomorrow.  Return to work: You may resume your work activities in one to two days or as indicated by your doctor.  What to expect after your surgery: Expect to have vaginal bleeding/discharge for 2-3 days and spotting for up to 10 days. It is not unusual to have soreness for up to 1-2 weeks. You may have a slight burning sensation when you urinate for the first day. Mild cramps may continue for a couple of days. You may have a regular period in 2-6 weeks.  Call your doctor for any of the following:  Excessive vaginal bleeding, saturating and changing one pad every hour.  Inability to urinate 6 hours after discharge from hospital.  Pain not relieved by pain medication.  Fever of 100.4 F or greater.  Unusual vaginal discharge or odor.    Post Anesthesia Home Care Instructions  Activity: Get plenty of rest for the remainder of the day. A responsible individual must stay with you for 24 hours following the procedure.  For the next 24 hours, DO NOT: -Drive a car -Paediatric nurse -Drink alcoholic  beverages -Take any medication unless instructed by your physician -Make any legal decisions or sign important papers.  Meals: Start with liquid foods such as gelatin or soup. Progress to regular foods as tolerated. Avoid greasy, spicy, heavy foods. If nausea and/or vomiting occur, drink only clear liquids until the nausea and/or vomiting subsides. Call your physician if vomiting continues.  Special Instructions/Symptoms: Your throat may feel dry or sore from the anesthesia or the breathing tube placed in your throat during surgery. If this causes discomfort, gargle with warm salt water. The discomfort should disappear within 24 hours.       **MAY TAKE ACETAMINOPHEN/TYLENOL BEGINNING AT 2PM TODAY IF NEEDED.  **MAY TAKE IBUPROFEN/ADVIL/MOTRIN/NAPROXEN BEGINNING AT 4PM TODAY IF NEEDED.

## 2021-06-01 NOTE — Anesthesia Preprocedure Evaluation (Addendum)
Anesthesia Evaluation  Patient identified by MRN, date of birth, ID band Patient awake    Reviewed: Allergy & Precautions, NPO status , Patient's Chart, lab work & pertinent test results  Airway Mallampati: II  TM Distance: >3 FB Neck ROM: Full    Dental no notable dental hx. (+) Teeth Intact, Dental Advisory Given   Pulmonary neg pulmonary ROS,  Covid 04/2021 all symptoms resolved   Pulmonary exam normal breath sounds clear to auscultation       Cardiovascular negative cardio ROS Normal cardiovascular exam Rhythm:Regular Rate:Normal     Neuro/Psych negative neurological ROS  negative psych ROS   GI/Hepatic negative GI ROS, Neg liver ROS,   Endo/Other  negative endocrine ROS  Renal/GU negative Renal ROS  negative genitourinary   Musculoskeletal negative musculoskeletal ROS (+)   Abdominal   Peds  Hematology negative hematology ROS (+)   Anesthesia Other Findings Endometrial polyps  Reproductive/Obstetrics                            Anesthesia Physical Anesthesia Plan  ASA: 1  Anesthesia Plan: General   Post-op Pain Management:    Induction: Intravenous  PONV Risk Score and Plan: 3 and Ondansetron, Dexamethasone and Midazolam  Airway Management Planned: LMA  Additional Equipment:   Intra-op Plan:   Post-operative Plan: Extubation in OR  Informed Consent: I have reviewed the patients History and Physical, chart, labs and discussed the procedure including the risks, benefits and alternatives for the proposed anesthesia with the patient or authorized representative who has indicated his/her understanding and acceptance.     Dental advisory given  Plan Discussed with: CRNA  Anesthesia Plan Comments:        Anesthesia Quick Evaluation

## 2021-06-01 NOTE — Discharge Summary (Signed)
Physician Discharge Summary  Patient ID: Felicia Banks MRN: [id number] DOB/AGE: [birth year] 40 y.o.  Admit date: 06/01/2021 Discharge date: 06/01/2021  Admission Diagnoses: Endometrial polyps Recurrent pregnancy loss       Discharge Diagnoses: same Active Problems:   Endometrial polyp   Discharged Condition: stable  Hospital Course: pt toelrated surgery with no complications. Deemed stable for discharge  Consults: None  Significant Diagnostic Studies: stable vitals  Treatments: surgery: hysteroscopy with myosure   Discharge Exam: Blood pressure 119/76, pulse 74, temperature 98 F (36.7 C), resp. rate 19, height 5\' 9"  (1.753 m), weight 78.7 kg, last menstrual period 05/01/2021, SpO2 99 %, unknown if currently breastfeeding. General appearance: alert, cooperative, and no distress  Disposition: Discharge disposition: 01-Home or Self Care       Discharge Instructions     Call MD for:  difficulty breathing, headache or visual disturbances   Complete by: As directed    Call MD for:  persistant dizziness or light-headedness   Complete by: As directed    Call MD for:  persistant nausea and vomiting   Complete by: As directed    Call MD for:  severe uncontrolled pain   Complete by: As directed    Call MD for:  temperature >100.4   Complete by: As directed    Diet - low sodium heart healthy   Complete by: As directed    Discharge instructions   Complete by: As directed    Call office with any concerns [phone]   Increase activity slowly   Complete by: As directed    No wound care   Complete by: As directed       Allergies as of 06/01/2021   No Known Allergies      Medication List     TAKE these medications    ibuprofen 600 MG tablet Commonly known as: ADVIL Take 1 tablet (600 mg total) by mouth every 6 (six) hours as needed for moderate pain.   oxyCODONE-acetaminophen 5-325 MG tablet Commonly known as: Percocet Take 1 tablet by mouth every 4  (four) hours as needed for up to 3 days for severe pain.   prenatal multivitamin Tabs tablet Take 1 tablet by mouth daily at 12 noon.   vitamin C with rose hips 500 MG tablet Take 500 mg by mouth daily. Vitamin C Gummy with rose hips QD        Follow-up Information     Sherlyn Hay, DO. Schedule an appointment as soon as possible for a visit.   Specialty: Obstetrics and Gynecology Why: 7-10 days for post operative visit Contact information: [place] [address] [phone]                 Signed: Isaiah Serge 06/01/2021, 10:53 AM

## 2021-06-01 NOTE — Anesthesia Procedure Notes (Signed)
Procedure Name: LMA Insertion Date/Time: 06/01/2021 9:47 AM Performed by: Bonney Aid, CRNA Pre-anesthesia Checklist: Patient identified, Emergency Drugs available, Suction available and Patient being monitored Patient Re-evaluated:Patient Re-evaluated prior to induction Oxygen Delivery Method: Circle system utilized Preoxygenation: Pre-oxygenation with 100% oxygen Induction Type: IV induction Ventilation: Mask ventilation without difficulty LMA: LMA inserted LMA Size: 4.0 Number of attempts: 1 Airway Equipment and Method: Bite block Placement Confirmation: positive ETCO2 Tube secured with: Tape Dental Injury: Teeth and Oropharynx as per pre-operative assessment

## 2021-06-02 ENCOUNTER — Encounter (HOSPITAL_BASED_OUTPATIENT_CLINIC_OR_DEPARTMENT_OTHER): Payer: Self-pay | Admitting: Obstetrics and Gynecology

## 2021-06-02 LAB — SURGICAL PATHOLOGY

## 2021-10-23 LAB — OB RESULTS CONSOLE HEPATITIS B SURFACE ANTIGEN: Hepatitis B Surface Ag: NEGATIVE

## 2021-10-23 LAB — HEPATITIS C ANTIBODY: HCV Ab: NEGATIVE

## 2021-10-23 LAB — OB RESULTS CONSOLE RPR: RPR: NONREACTIVE

## 2021-10-23 LAB — OB RESULTS CONSOLE GC/CHLAMYDIA
Chlamydia: NEGATIVE
Neisseria Gonorrhea: NEGATIVE

## 2021-10-23 LAB — OB RESULTS CONSOLE HIV ANTIBODY (ROUTINE TESTING): HIV: NONREACTIVE

## 2021-10-23 LAB — OB RESULTS CONSOLE RUBELLA ANTIBODY, IGM: Rubella: IMMUNE

## 2021-11-19 NOTE — L&D Delivery Note (Signed)
Patient was C/C/+2 and pushed for 20 minutes with epidural.    NSVD  female infant, Apgars 8,9, weight P.   Nuchal cord x4 reduced without comp. The patient had midline perineal second degree laceration repaired with 2-0 vicryl. Fundus was firm. EBL was expected amount. Placenta was delivered intact. Vagina was clear.  Delayed cord clamping done for 30-60 seconds while warming baby. Baby was vigorous and doing skin to skin with mother.  Felicia Banks

## 2022-04-24 LAB — OB RESULTS CONSOLE GBS: GBS: NEGATIVE

## 2022-05-08 IMAGING — DX DG CHEST 2V
2 series · 2 of 2 positions shown · non-contrast
Comparison: None.

CLINICAL DATA: Midsternal chest pain.

EXAM:
CHEST - 2 VIEW

[chest pa]
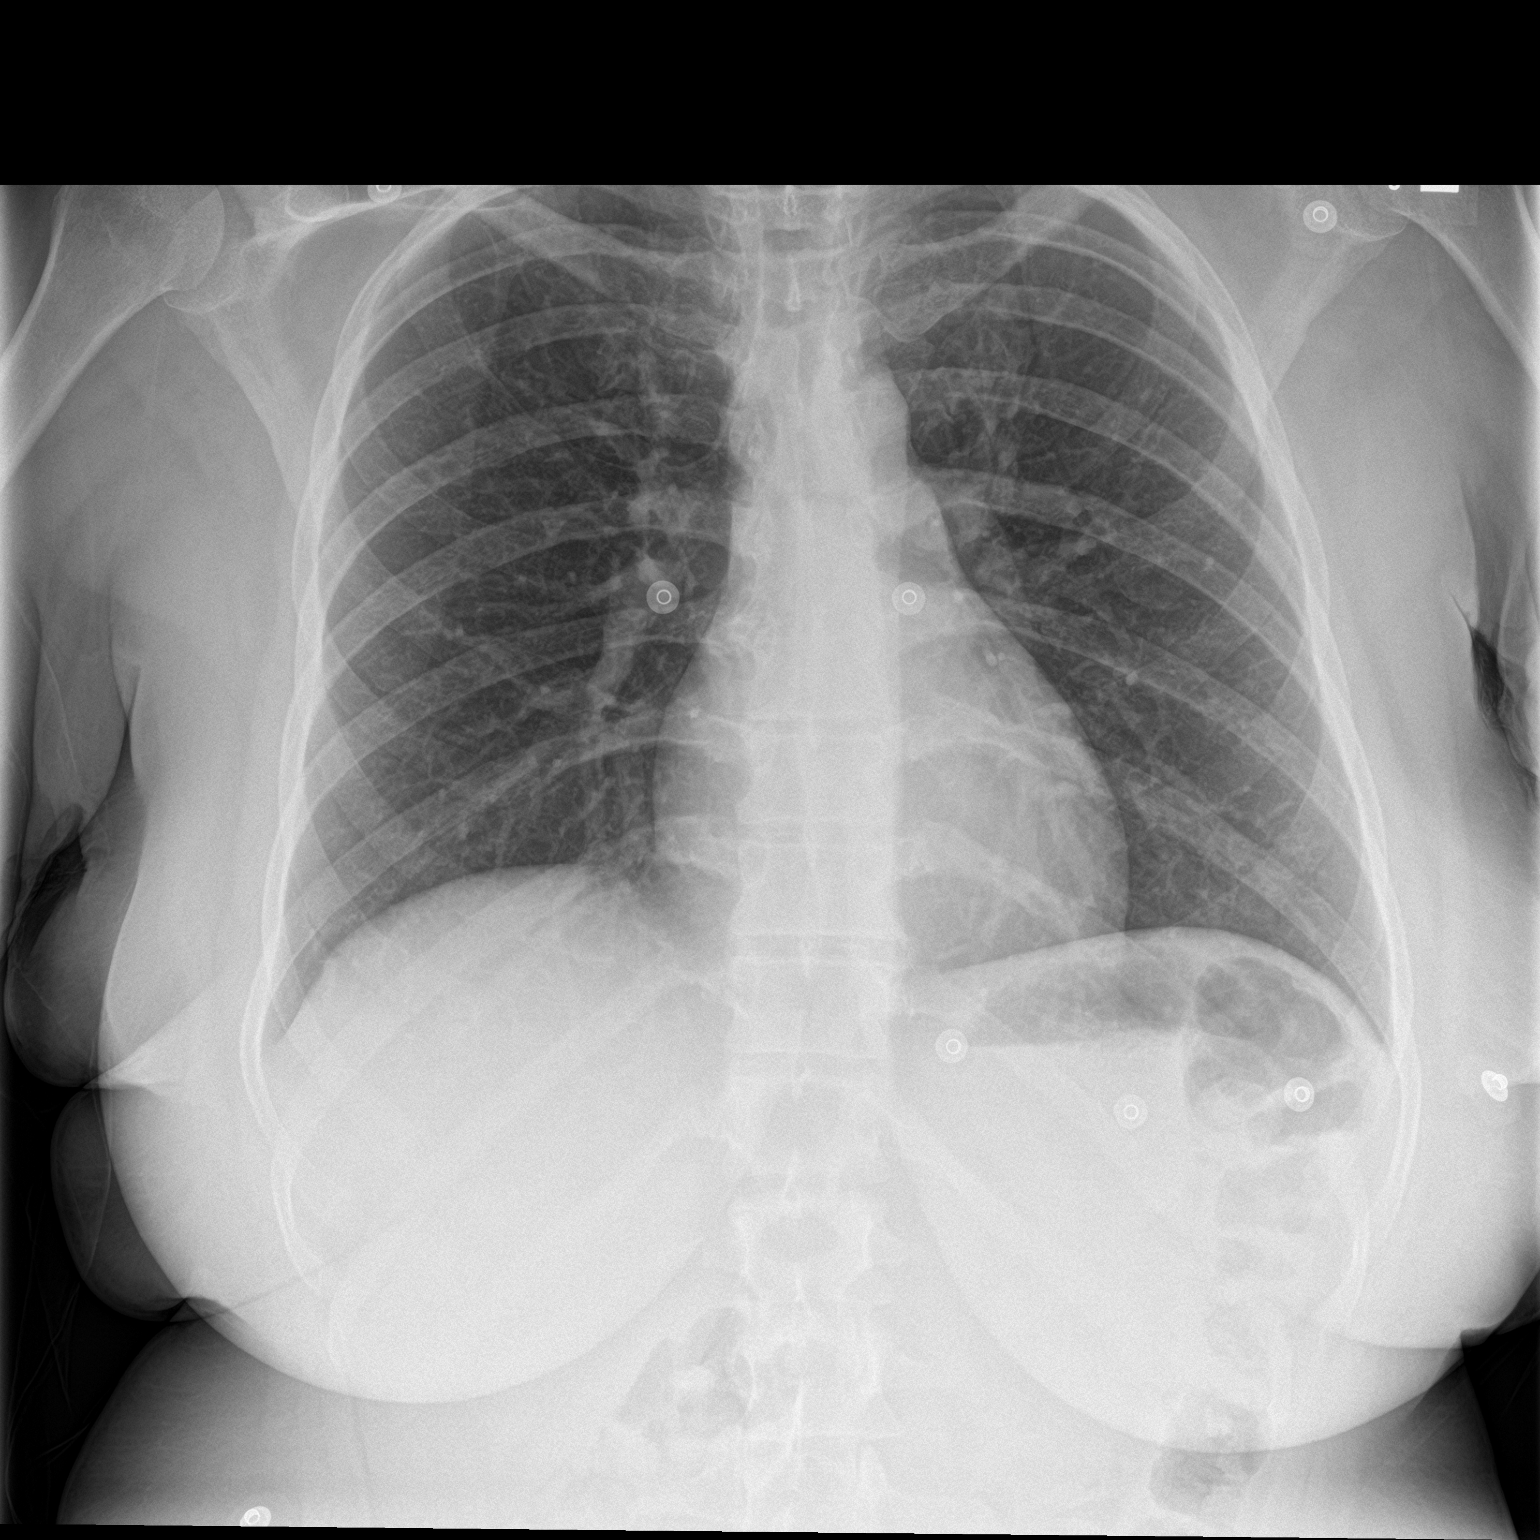

[chest lat]
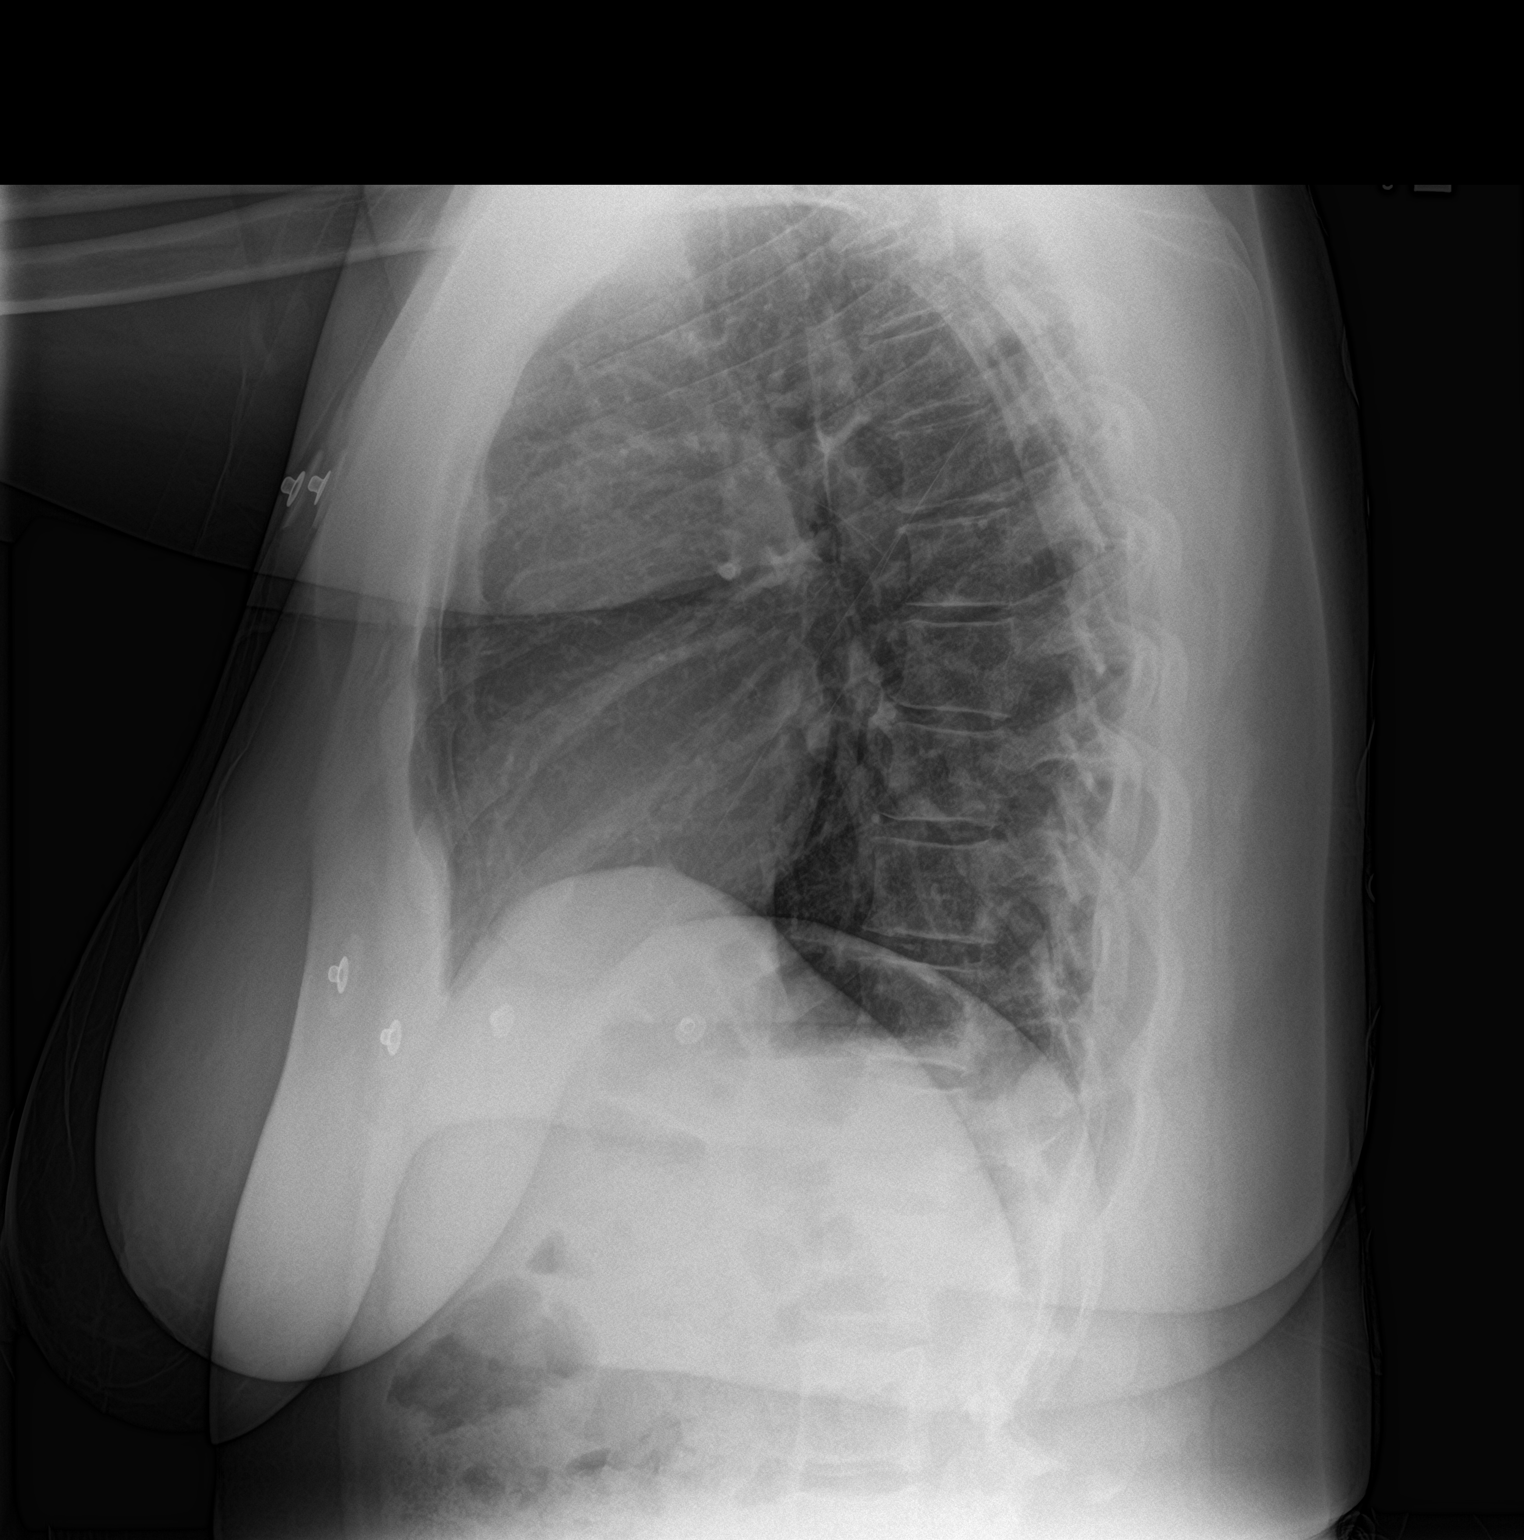

[2 of 2 positions shown; findings below may reference images not displayed]

FINDINGS: The cardiomediastinal contours are within normal limits. The lungs
are clear. No pneumothorax or pleural effusion. No acute finding in
the visualized skeleton.
IMPRESSION: No active cardiopulmonary disease.

## 2022-05-22 ENCOUNTER — Encounter (HOSPITAL_COMMUNITY): Payer: Self-pay | Admitting: *Deleted

## 2022-05-22 ENCOUNTER — Encounter (HOSPITAL_COMMUNITY): Payer: Self-pay | Admitting: Anesthesiology

## 2022-05-22 ENCOUNTER — Other Ambulatory Visit: Payer: Self-pay

## 2022-05-22 ENCOUNTER — Inpatient Hospital Stay (HOSPITAL_COMMUNITY)
Admission: AD | Admit: 2022-05-22 | Discharge: 2022-05-25 | DRG: 807 | Disposition: A | Discharge status: Home / Self Care | Payer: Medicaid Other | Attending: Obstetrics and Gynecology | Admitting: Obstetrics and Gynecology

## 2022-05-22 DIAGNOSIS — J45909 Unspecified asthma, uncomplicated: Secondary | ICD-10-CM | POA: Diagnosis present

## 2022-05-22 DIAGNOSIS — D573 Sickle-cell trait: Secondary | ICD-10-CM | POA: Diagnosis present

## 2022-05-22 DIAGNOSIS — Z3A40 40 weeks gestation of pregnancy: Secondary | ICD-10-CM

## 2022-05-22 DIAGNOSIS — O3413 Maternal care for benign tumor of corpus uteri, third trimester: Secondary | ICD-10-CM | POA: Diagnosis present

## 2022-05-22 DIAGNOSIS — O9952 Diseases of the respiratory system complicating childbirth: Secondary | ICD-10-CM | POA: Diagnosis present

## 2022-05-22 DIAGNOSIS — Z8616 Personal history of COVID-19: Secondary | ICD-10-CM | POA: Diagnosis not present

## 2022-05-22 DIAGNOSIS — D259 Leiomyoma of uterus, unspecified: Secondary | ICD-10-CM | POA: Diagnosis present

## 2022-05-22 DIAGNOSIS — O26893 Other specified pregnancy related conditions, third trimester: Secondary | ICD-10-CM | POA: Diagnosis present

## 2022-05-22 DIAGNOSIS — O479 False labor, unspecified: Principal | ICD-10-CM | POA: Diagnosis present

## 2022-05-22 DIAGNOSIS — O9902 Anemia complicating childbirth: Secondary | ICD-10-CM | POA: Diagnosis present

## 2022-05-22 LAB — COMPREHENSIVE METABOLIC PANEL
ALT: 13 U/L (ref 0–44)
AST: 21 U/L (ref 15–41)
Albumin: 2.8 g/dL — ABNORMAL LOW (ref 3.5–5.0)
Alkaline Phosphatase: 99 U/L (ref 38–126)
Anion gap: 10 (ref 5–15)
BUN: 8 mg/dL (ref 6–20)
CO2: 22 mmol/L (ref 22–32)
Calcium: 9.7 mg/dL (ref 8.9–10.3)
Chloride: 105 mmol/L (ref 98–111)
Creatinine, Ser: 0.75 mg/dL (ref 0.44–1.00)
GFR, Estimated: >60 mL/min (ref >60)
Glucose, Bld: 88 mg/dL (ref 70–99)
Potassium: 4.4 mmol/L (ref 3.5–5.1)
Sodium: 137 mmol/L (ref 135–145)
Total Bilirubin: 0.9 mg/dL (ref 0.3–1.2)
Total Protein: 6.7 g/dL (ref 6.5–8.1)

## 2022-05-22 LAB — TYPE AND SCREEN
ABO/RH(D): O POS
Antibody Screen: NEGATIVE

## 2022-05-22 LAB — CBC
HCT: 31.7 % — ABNORMAL LOW (ref 36.0–46.0)
Hemoglobin: 10.7 g/dL — ABNORMAL LOW (ref 12.0–15.0)
MCH: 27.9 pg (ref 26.0–34.0)
MCHC: 33.8 g/dL (ref 30.0–36.0)
MCV: 82.8 fL (ref 80.0–100.0)
Platelets: 213 10*3/uL (ref 150–400)
RBC: 3.83 MIL/uL — ABNORMAL LOW (ref 3.87–5.11)
RDW: 14.8 % (ref 11.5–15.5)
WBC: 8 10*3/uL (ref 4.0–10.5)
nRBC: 0 % (ref 0.0–0.2)

## 2022-05-22 MED ORDER — TERBUTALINE SULFATE 1 MG/ML IJ SOLN: 0.2500 mg | Freq: Once | INTRAMUSCULAR | Status: DC | PRN

## 2022-05-22 MED ORDER — OXYTOCIN-SODIUM CHLORIDE 30-0.9 UT/500ML-% IV SOLN
1.0000 m[IU]/min | INTRAVENOUS | Status: DC
  Administered 2022-05-22: 2 m[IU]/min via INTRAVENOUS

## 2022-05-22 MED ORDER — LIDOCAINE HCL (PF) 1 % IJ SOLN
30.0000 mL | INTRAMUSCULAR | Status: AC | PRN
  Administered 2022-05-23: 30 mL via SUBCUTANEOUS
  Filled 2022-05-22: qty 30

## 2022-05-22 MED ORDER — OXYTOCIN BOLUS FROM INFUSION: 333.0000 mL | Freq: Once | INTRAVENOUS | Status: DC

## 2022-05-22 MED ORDER — OXYCODONE-ACETAMINOPHEN 5-325 MG PO TABS: 1.0000 | ORAL_TABLET | ORAL | Status: DC | PRN

## 2022-05-22 MED ORDER — LACTATED RINGERS IV SOLN: 500.0000 mL | INTRAVENOUS | Status: DC | PRN

## 2022-05-22 MED ORDER — ACETAMINOPHEN 325 MG PO TABS
650.0000 mg | ORAL_TABLET | ORAL | Status: DC | PRN
Start: 2022-05-22 — End: 2022-05-23

## 2022-05-22 MED ORDER — OXYCODONE-ACETAMINOPHEN 5-325 MG PO TABS: 2.0000 | ORAL_TABLET | ORAL | Status: DC | PRN

## 2022-05-22 MED ORDER — SOD CITRATE-CITRIC ACID 500-334 MG/5ML PO SOLN: 30.0000 mL | ORAL | Status: DC | PRN

## 2022-05-22 MED ORDER — ONDANSETRON HCL 4 MG/2ML IJ SOLN
4.0000 mg | Freq: Four times a day (QID) | INTRAMUSCULAR | Status: DC | PRN
Start: 2022-05-22 — End: 2022-05-23

## 2022-05-22 MED ORDER — OXYTOCIN-SODIUM CHLORIDE 30-0.9 UT/500ML-% IV SOLN
2.5000 [IU]/h | INTRAVENOUS | Status: DC
  Filled 2022-05-22 (×2): qty 500

## 2022-05-22 MED ORDER — FENTANYL CITRATE (PF) 100 MCG/2ML IJ SOLN
50.0000 ug | INTRAMUSCULAR | Status: DC | PRN
  Administered 2022-05-23 (×2): 100 ug via INTRAVENOUS
  Filled 2022-05-22 (×2): qty 2

## 2022-05-22 MED ORDER — LACTATED RINGERS IV SOLN: INTRAVENOUS | Status: DC

## 2022-05-22 NOTE — H&P (Signed)
Johnna Bollier is a 41 y.o. female [phone] 61w0dpresented to MAU with painful contractions every 5 mins that started today at 1700. Reports pain 7/10. No LOF or VB. Good FM.    Pregnancy c/b: Advanced maternal age: low risk NIPT, has been on ASA prophylaxis Had 2 syncopal episodes after her last delivery in 203-21-18(still in delivery room): [place] [place] had normal [place], Holter monitor with rare PAC. Recommend 24 hour postpartum telemetry FOB has h/o genital herpes: patient has been on Valtrex prophylaxis, denies outbreaks Uterine fibroids: 3 notes, all < 1.5cm Sickle cell trait carrier: FOB negative Asthma: has inhaler PRN, has not needed in pregnancy  OB History     Gravida  5   Para  1   Term  1   Preterm      AB  3   Living  1      SAB  3   IAB  0   Ectopic      Multiple  0   Live Births             Past Medical History:  Diagnosis Date   COVID 04/19/2021   fever, headache, cough, of appitie, and mucus in her chest x 2 weeks all symtoms resolved   Family history of adverse reaction to anesthesia    Mother had complication during whiplie procedure 203-21-20 deceased soon after surgery   HSV (herpes simplex virus) anogenital infection    Wears glasses    glasses or contacts   Past Surgical History:  Procedure Laterality Date   ANKLE SURGERY  10/2006   left   DILATATION & CURETTAGE/HYSTEROSCOPY WITH MYOSURE N/A 06/01/2021   Procedure: DILATATION & CURETTAGE/HYSTEROSCOPY WITH MYOSURE;  Surgeon: BSherlyn Hay DO;  [facility]  Service: Gynecology;  Laterality: N/A;   INDUCED ABORTION     Family History: family history includes Diabetes in her mother; Heart attack in her mother. Social History:  reports that she has never smoked. She has never been exposed to tobacco smoke. She has never used smokeless tobacco. She reports current alcohol use. She reports that she does not use  drugs.     Maternal Diabetes: No Genetic Screening: Normal Maternal Ultrasounds/Referrals: Normal Fetal Ultrasounds or other Referrals:  None Maternal Substance Abuse:  No Significant Maternal Medications:  none Significant Maternal Lab Results:  Group B Strep negative Other Comments:  None  Review of Systems Per HPI Exam Physical Exam  Dilation: 3 Effacement (%): 50 Station: Ballotable Exam by:: [employer] RN Blood pressure 115/75, pulse (!) 108, temperature 98.5 F (36.9 C), resp. rate 20, last menstrual period 05/01/2021, SpO2 100 %, unknown if currently breastfeeding. Gen: NAD, uncomfortable with contractions CVS: normal pulses Lungs: nonlabored respirations Abd: Gravid abdomen Pelvic: no herpes lesions on speculum and vulvar exam Ext: no calf edema or tenderness  [facility] CNM: vertex Fetal testing: 150bpm, mod variability, + accels, no decels Toco: Ctx q 3.5-4 mins Prenatal labs: ABO, Rh:  --/--/O POS (07/14 0802) Antibody: NEG (07/14 0802) Rubella: Immune (12/05 0000) RPR: Nonreactive (12/05 0000)  HBsAg: Negative (12/05 0000)  HIV: Non-reactive (12/05 0000)  GBS: Negative/-- (06/06 0000)   Assessment/Plan: 453G [id number]@ 492w0dlatent labor, very uncomfortable and requesting admission for pain control. Given age 1777t term, appropriate to admit and augment labor.  Fetal wellbeing: cat I tracing Latent labor: start pitocin, AROM when fetal station lower Pain control: IV pain meds or  nitrous oxide per protocol, epidural upon request H/o syncopal episode: likely hypotension/vasovagal after getting up from delivery, cardiologist recommend telemetry for 24 hours after delivery, will plan to start on L&D and transfer to [facility] for postpartum telemetry  Rowland Lathe 05/22/2022, 9:43 PM

## 2022-05-22 NOTE — Anesthesia Preprocedure Evaluation (Signed)
Anesthesia Evaluation    Reviewed: Allergy & Precautions, Patient's Chart, lab work & pertinent test results  Airway        Dental   Pulmonary asthma ,           Cardiovascular   Had 2 syncopal episodes after her last delivery in 2018 (still in delivery room): saw cardiology [facility], notes in [place]), had normal echo, Holter monitor with rare PAC. Recommend 24 hour postpartum telemetry   Neuro/Psych    GI/Hepatic negative GI ROS, Neg liver ROS,   Endo/Other  negative endocrine ROS  Renal/GU negative Renal ROS     Musculoskeletal   Abdominal   Peds  Hematology  (+) Blood dyscrasia, Sickle cell trait , Lab Results      Component                Value               Date                      WBC                      8.0                 05/22/2022                HGB                      10.7 (L)            05/22/2022                HCT                      31.7 (L)            05/22/2022                MCV                      82.8                05/22/2022                PLT                      213                 05/22/2022              Anesthesia Other Findings   Reproductive/Obstetrics (+) Pregnancy                             Anesthesia Physical Anesthesia Plan  ASA: 3  Anesthesia Plan: Epidural   Post-op Pain Management:    Induction:   PONV Risk Score and Plan: Treatment may vary due to age or medical condition  Airway Management Planned:   Additional Equipment:   Intra-op Plan:   Post-operative Plan:   Informed Consent:   Plan Discussed with:   Anesthesia Plan Comments: (40 wk G5P1 w Asthma for LEA)        Anesthesia Quick Evaluation

## 2022-05-22 NOTE — Progress Notes (Signed)
Cephalic by BSUS.

## 2022-05-22 NOTE — MAU Note (Signed)
.  Felicia Banks is a 41 y.o. at 21w0dhere in MAU reporting: Pt reports ctx's since 1700.  Denies vaginal bleeding or LOF  Reports +fm   Onset of complaint: today  Pain score: 7/10 abdomen  There were no vitals filed for this visit.    Lab orders placed from triage:   ua

## 2022-05-23 ENCOUNTER — Encounter (HOSPITAL_COMMUNITY): Payer: Self-pay | Admitting: Obstetrics and Gynecology

## 2022-05-23 LAB — RPR: RPR Ser Ql: NONREACTIVE

## 2022-05-23 MED ORDER — METHYLERGONOVINE MALEATE 0.2 MG PO TABS: 0.2000 mg | ORAL_TABLET | ORAL | Status: DC | PRN

## 2022-05-23 MED ORDER — METHYLERGONOVINE MALEATE 0.2 MG/ML IJ SOLN: 0.2000 mg | INTRAMUSCULAR | Status: DC | PRN

## 2022-05-23 MED ORDER — WITCH HAZEL-GLYCERIN EX PADS: 1.0000 | MEDICATED_PAD | CUTANEOUS | Status: DC | PRN

## 2022-05-23 MED ORDER — MEASLES, MUMPS & RUBELLA VAC IJ SOLR: 0.5000 mL | Freq: Once | INTRAMUSCULAR | Status: DC

## 2022-05-23 MED ORDER — COCONUT OIL OIL
1.0000 | TOPICAL_OIL | Status: DC | PRN
  Administered 2022-05-24: 1 via TOPICAL

## 2022-05-23 MED ORDER — MISOPROSTOL 200 MCG PO TABS
ORAL_TABLET | ORAL | Status: AC
  Filled 2022-05-23: qty 4

## 2022-05-23 MED ORDER — OXYCODONE-ACETAMINOPHEN 5-325 MG PO TABS
2.0000 | ORAL_TABLET | ORAL | Status: DC | PRN
  Administered 2022-05-24 (×2): 2 via ORAL
  Filled 2022-05-23 (×2): qty 2

## 2022-05-23 MED ORDER — FERROUS SULFATE 325 (65 FE) MG PO TABS
325.0000 mg | ORAL_TABLET | Freq: Two times a day (BID) | ORAL | Status: DC
  Administered 2022-05-24 – 2022-05-25 (×3): 325 mg via ORAL
  Filled 2022-05-23 (×3): qty 1

## 2022-05-23 MED ORDER — DIBUCAINE (PERIANAL) 1 % EX OINT: 1.0000 | TOPICAL_OINTMENT | CUTANEOUS | Status: DC | PRN

## 2022-05-23 MED ORDER — SIMETHICONE 80 MG PO CHEW: 80.0000 mg | CHEWABLE_TABLET | ORAL | Status: DC | PRN

## 2022-05-23 MED ORDER — SODIUM CHLORIDE 0.9 % IV SOLN: 250.0000 mL | INTRAVENOUS | Status: DC | PRN

## 2022-05-23 MED ORDER — IBUPROFEN 800 MG PO TABS
800.0000 mg | ORAL_TABLET | Freq: Three times a day (TID) | ORAL | Status: DC
  Administered 2022-05-23 – 2022-05-25 (×5): 800 mg via ORAL
  Filled 2022-05-23 (×5): qty 1

## 2022-05-23 MED ORDER — SODIUM CHLORIDE 0.9% FLUSH
3.0000 mL | Freq: Two times a day (BID) | INTRAVENOUS | Status: DC
  Administered 2022-05-23 – 2022-05-24 (×3): 3 mL via INTRAVENOUS

## 2022-05-23 MED ORDER — TRANEXAMIC ACID-NACL 1000-0.7 MG/100ML-% IV SOLN: 1000.0000 mg | INTRAVENOUS | Status: AC

## 2022-05-23 MED ORDER — TRANEXAMIC ACID-NACL 1000-0.7 MG/100ML-% IV SOLN
INTRAVENOUS | Status: AC
  Administered 2022-05-23: 1000 mg via INTRAVENOUS
  Filled 2022-05-23: qty 100

## 2022-05-23 MED ORDER — ONDANSETRON HCL 4 MG/2ML IJ SOLN: 4.0000 mg | INTRAMUSCULAR | Status: DC | PRN

## 2022-05-23 MED ORDER — MAGNESIUM HYDROXIDE 400 MG/5ML PO SUSP: 30.0000 mL | ORAL | Status: DC | PRN

## 2022-05-23 MED ORDER — PRENATAL MULTIVITAMIN CH
1.0000 | ORAL_TABLET | Freq: Every day | ORAL | Status: DC
  Administered 2022-05-24: 1 via ORAL
  Filled 2022-05-23: qty 1

## 2022-05-23 MED ORDER — ZOLPIDEM TARTRATE 5 MG PO TABS: 5.0000 mg | ORAL_TABLET | Freq: Every evening | ORAL | Status: DC | PRN

## 2022-05-23 MED ORDER — ONDANSETRON HCL 4 MG PO TABS: 4.0000 mg | ORAL_TABLET | ORAL | Status: DC | PRN

## 2022-05-23 MED ORDER — DIPHENHYDRAMINE HCL 25 MG PO CAPS: 25.0000 mg | ORAL_CAPSULE | Freq: Four times a day (QID) | ORAL | Status: DC | PRN

## 2022-05-23 MED ORDER — SENNOSIDES-DOCUSATE SODIUM 8.6-50 MG PO TABS
2.0000 | ORAL_TABLET | Freq: Every day | ORAL | Status: DC
  Administered 2022-05-24: 2 via ORAL
  Filled 2022-05-23: qty 2

## 2022-05-23 MED ORDER — BENZOCAINE-MENTHOL 20-0.5 % EX AERO
1.0000 | INHALATION_SPRAY | CUTANEOUS | Status: DC | PRN
  Administered 2022-05-23: 1 via TOPICAL
  Filled 2022-05-23: qty 56

## 2022-05-23 MED ORDER — TETANUS-DIPHTH-ACELL PERTUSSIS 5-2.5-18.5 LF-MCG/0.5 IM SUSY: 0.5000 mL | PREFILLED_SYRINGE | Freq: Once | INTRAMUSCULAR | Status: DC

## 2022-05-23 MED ORDER — ACETAMINOPHEN 325 MG PO TABS
650.0000 mg | ORAL_TABLET | ORAL | Status: DC | PRN
  Administered 2022-05-23 (×2): 650 mg via ORAL
  Filled 2022-05-23 (×3): qty 2

## 2022-05-23 MED ORDER — SODIUM CHLORIDE 0.9% FLUSH: 3.0000 mL | INTRAVENOUS | Status: DC | PRN

## 2022-05-23 NOTE — Lactation Note (Signed)
This note was copied from a baby's chart. Lactation Consultation Note  Patient Name: Felicia Banks [id number] Date: 05/23/2022   Age:41 hours  LC attempted to visit with mom, but mom states that she has not eaten since 2:00 am and would like to have something to eat prior to latching baby. [place] congratulated the parents and let her know that someone from lactation would see her on the [facility].   Maternal Data    Feeding    LATCH Score                    Lactation Tools Discussed/Used    Interventions    Discharge    Consult Status      Felicia Banks 05/23/2022, 6:04 PM

## 2022-05-23 NOTE — Progress Notes (Signed)
Cardiac tele initiated in anticipation of delivery per cardiology recommendation.

## 2022-05-23 NOTE — Progress Notes (Signed)
Pt on OB Spec for continued cardiac monitoring secondary history of passing out first delivery.  Pt had no problems during labor.    Vitals:   05/23/22 1820 05/23/22 1830 05/23/22 1840 05/23/22 1959  BP: 118/82 117/72 125/77 114/70  Pulse: (!) 101 94 95 (!) 104  Resp:    20  Temp:    98.2 F (36.8 C)  TempSrc:    Oral  SpO2:    99%

## 2022-05-23 NOTE — Lactation Note (Signed)
This note was copied from a baby's chart. Lactation Consultation Note Mom stated baby was cueing so she wanted to try to latch. As LC was repositioning baby noted soiled diaper and on blanket. LC changed baby and cleaned up baby. Baby given to mom. Not very interested in BF at this time. Latched for a brief minute. Nothing to count. Mom wanted baby swaddled and hold baby. Baby sleeping. Newborn feeding habits, STS, I&O, supply and demand reviewed. Mom encouraged to feed baby 8-12 times/24 hours and with feeding cues.   RN had shells in room. LC put them together and explained how to ear them. Encouraged mom to rest tonight that baby would be cluster feeding tomorrow. Call for assistance as needed.  Patient Name: Felicia Banks [id number] Date: 05/23/2022 Reason for consult: Initial assessment;Term Age:12 hours  Maternal Data Has patient been taught Hand Expression?: Yes Does the patient have breastfeeding experience prior to this delivery?: Yes How long did the patient breastfeed?: pumped 13 months  Feeding    LATCH Score Latch: Too sleepy or reluctant, no latch achieved, no sucking elicited.  Audible Swallowing: None  Type of Nipple: Everted at rest and after stimulation (short shaft)  Comfort (Breast/Nipple): Soft / non-tender  Hold (Positioning): Assistance needed to correctly position infant at breast and maintain latch.  LATCH Score: 5   Lactation Tools Discussed/Used Tools: Shells (RN gave shells)  Interventions Interventions: Breast feeding basics reviewed;Adjust position;Assisted with latch;Support pillows;Skin to skin;Position options;Breast massage;Hand express;Breast compression;LC Services brochure  Discharge    Consult Status Consult Status: Follow-up Date: 05/24/22 Follow-up type: In-patient    Theodoro Kalata 05/23/2022, 9:39 PM

## 2022-05-23 NOTE — Progress Notes (Signed)
First push, MD at BD, some movement noted

## 2022-05-23 NOTE — Progress Notes (Signed)
SVD baby girl skin to skin with mother

## 2022-05-23 NOTE — Progress Notes (Signed)
Doula at [facility], supporting pt with hip jiggles to facilitate baby moving down.

## 2022-05-24 ENCOUNTER — Inpatient Hospital Stay (HOSPITAL_COMMUNITY): Payer: Medicaid Other | Attending: Obstetrics and Gynecology

## 2022-05-24 ENCOUNTER — Inpatient Hospital Stay (HOSPITAL_COMMUNITY)
Admission: AD | Admit: 2022-05-24 | Payer: Medicaid Other | Source: Home / Self Care | Admitting: Obstetrics and Gynecology

## 2022-05-24 ENCOUNTER — Other Ambulatory Visit (HOSPITAL_COMMUNITY): Payer: Self-pay | Admitting: *Deleted

## 2022-05-24 LAB — CBC
HCT: 24.4 % — ABNORMAL LOW (ref 36.0–46.0)
Hemoglobin: 8.2 g/dL — ABNORMAL LOW (ref 12.0–15.0)
MCH: 27.4 pg (ref 26.0–34.0)
MCHC: 33.6 g/dL (ref 30.0–36.0)
MCV: 81.6 fL (ref 80.0–100.0)
Platelets: 192 10*3/uL (ref 150–400)
RBC: 2.99 MIL/uL — ABNORMAL LOW (ref 3.87–5.11)
RDW: 14.8 % (ref 11.5–15.5)
WBC: 9 10*3/uL (ref 4.0–10.5)
nRBC: 0 % (ref 0.0–0.2)

## 2022-05-24 NOTE — Lactation Note (Signed)
This note was copied from a baby's chart. Lactation Consultation Note  Patient Name: Felicia Banks [id number] Date: 05/24/2022   Age:41 hours  Mom in with provider and RN. LC to return later today to see how mom and baby are doing.   Maternal Data    Feeding    LATCH Score                    Lactation Tools Discussed/Used    Interventions    Discharge    Consult Status      [name]  [name] 05/24/2022, 12:11 PM

## 2022-05-24 NOTE — Progress Notes (Signed)
Post Partum Day 1 Subjective: no complaints, up ad lib, voiding, tolerating PO, + flatus, and lochia mild. She denies CP, SOB, lightheadedness, fever or HA. She is bonding well with baby; breastfeeding. She has been on telemetry but no episodes noted.   Objective: Blood pressure 106/63, pulse 100, temperature 97.6 F (36.4 C), temperature source Oral, resp. rate 17, height '5\' 9"'$  (1.753 m), weight 96.6 kg, last menstrual period 05/01/2021, SpO2 100 %, unknown if currently breastfeeding.  Physical Exam:  General: alert, cooperative, and no distress Lochia: appropriate Uterine Fundus: firm Incision: n/a DVT Evaluation: No evidence of DVT seen on physical exam.  Recent Labs    05/22/22 2131 05/24/22 0403  HGB 10.7* 8.2*  HCT 31.7* 24.4*    Assessment/Plan: Plan for discharge tomorrow and Breastfeeding Considering BTL pp for [facility] [place] to discontinue telemetry at 24hr mark if no incidents noted   LOS: 2 days   Felicia Monda W Wylie Russon, DO 05/24/2022, 12:13 PM

## 2022-05-25 MED ORDER — IBUPROFEN 600 MG PO TABS: 600.0000 mg | ORAL_TABLET | Freq: Four times a day (QID) | ORAL | 0 refills | Status: DC | PRN

## 2022-05-25 MED ORDER — IBUPROFEN 600 MG PO TABS: 600.0000 mg | ORAL_TABLET | Freq: Four times a day (QID) | ORAL | Status: DC | PRN

## 2022-05-25 NOTE — Discharge Instructions (Signed)
As per discharge pamphlet °

## 2022-05-25 NOTE — Discharge Summary (Signed)
Postpartum Discharge Summary      Patient Name: Felicia Banks DOB: 1981-02-13 MRN: [id number]  Date of admission: 05/22/2022 Delivery date:05/23/2022  Delivering provider: Bobbye Charleston  Date of discharge: 05/25/2022  Admitting diagnosis: Uterine contractions [O47.9] Intrauterine pregnancy: [redacted]w[redacted]d    Secondary diagnosis:  Principal Problem:   Uterine contractions     [facility] With Vaginal Delivery      41y.o. yo [id number] admitted in Latent Labor on 05/22/2022. Patient had an uncomplicated labor course as follows:  Membrane Rupture Time/Date: 4:15 PM ,05/23/2022   Delivery Method:Vaginal, Spontaneous  Episiotomy: None  Lacerations:  2nd degree  Patient had an uncomplicated postpartum course.  She was on telemetry for 24 hrs due to h/o syncope, no episodes.  She is ambulating, tolerating a regular diet, passing flatus, and urinating well. Patient is discharged home in stable condition on 05/25/22.  Newborn Data: Birth date:05/23/2022  Birth time:5:31 PM  Gender:Female  Living status:Living  Apgars:8 ,9  Weight:3820 g    Physical exam  Vitals:   05/24/22 1523 05/24/22 2010 05/25/22 0608 05/25/22 0745  BP: (!) 107/57 (!) 111/53 114/63 110/66  Pulse: 90 (!) 103 89 89  Resp: '19 16 16 18  '$ Temp: 98 F (36.7 C) 97.8 F (36.6 C) 97.9 F (36.6 C) 97.9 F (36.6 C)  TempSrc: Oral Oral Oral Oral  SpO2: 98% 100% 98% 99%  Weight:      Height:       General: alert Lochia: appropriate Uterine Fundus: firm Labs: Lab Results  Component Value Date   WBC 9.0 05/24/2022   HGB 8.2 (L) 05/24/2022   HCT 24.4 (L) 05/24/2022   MCV 81.6 05/24/2022   PLT 192 05/24/2022      Latest Ref Rng & Units 05/22/2022    9:31 PM  CMP  Glucose 70 - 99 mg/dL 88   BUN 6 - 20 mg/dL 8   Creatinine 0.44 - 1.00 mg/dL 0.75   Sodium 135 - 145 mmol/L 137   Potassium 3.5 -  5.1 mmol/L 4.4   Chloride 98 - 111 mmol/L 105   CO2 22 - 32 mmol/L 22   Calcium 8.9 - 10.3 mg/dL 9.7   Total Protein 6.5 - 8.1 g/dL 6.7   Total Bilirubin 0.3 - 1.2 mg/dL 0.9   Alkaline Phos 38 - 126 U/L 99   AST 15 - 41 U/L 21   ALT 0 - 44 U/L 13    Edinburgh Score:    05/24/2022   12:15 PM  Edinburgh Postnatal Depression Scale Screening Tool  I have been able to laugh and see the funny side of things. 0  I have looked forward with enjoyment to things. 0  I have blamed myself unnecessarily when things went wrong. 1  I have been anxious or worried for no good reason. 1  I have felt scared or panicky for no good reason. 0  Things have been getting on top of me. 0  I have been so unhappy that I have had difficulty sleeping. 0  I have felt sad or miserable. 0  I have been so unhappy that I have been crying.  0  The thought of harming myself has occurred to me. 0  Edinburgh Postnatal Depression Scale Total 2      After visit meds:  Allergies as of 05/25/2022   No Known Allergies      Medication List     STOP taking these medications    aspirin EC 81 MG tablet   valACYclovir 500 MG tablet Commonly known as: VALTREX       TAKE these medications    ibuprofen 600 MG tablet Commonly known as: ADVIL Take 1 tablet (600 mg total) by mouth every 6 (six) hours as needed for moderate pain. What changed: Another medication with the same name was added. Make sure you understand how and when to take each.   ibuprofen 600 MG tablet Commonly known as: ADVIL Take 1 tablet (600 mg total) by mouth every 6 (six) hours as needed for moderate pain. What changed: You were already taking a medication with the same name, and this prescription was added. Make sure you understand how and when to take each.   prenatal multivitamin Tabs tablet Take 1 tablet by mouth daily at 12 noon.   vitamin C with rose hips 500 MG tablet Take 500 mg by mouth daily. Vitamin C Gummy with rose hips QD          Discharge home in stable condition Infant Feeding: Breast Infant Disposition:home with mother Discharge instruction: per After Visit Summary and Postpartum booklet. Activity: Advance as tolerated. Pelvic rest for 6 weeks.  Diet: routine diet Postpartum Appointment:6 weeks Follow up Visit:  [place], [place], DO. Schedule an appointment as soon as possible for a visit in 6 week(s).   Specialty: Obstetrics and Gynecology Contact information: [address] [place] [address] [phone]                     05/25/2022 [name], MD

## 2022-05-25 NOTE — Progress Notes (Signed)
PPD #2 Doing well, no problems Afeb, VSS D/c home 

## 2022-05-25 NOTE — Plan of Care (Signed)
Pt to be discharged home with printed instructions. No concerns noted. Rohnan Bartleson L Montavis Schubring, RN  

## 2022-05-31 ENCOUNTER — Telehealth (HOSPITAL_COMMUNITY): Payer: Self-pay | Admitting: *Deleted

## 2022-05-31 NOTE — Telephone Encounter (Signed)
Left phone voicemail message.  Odis Hollingshead, RN 05-31-2022 at 11:40am

## 2022-07-26 ENCOUNTER — Encounter (HOSPITAL_BASED_OUTPATIENT_CLINIC_OR_DEPARTMENT_OTHER): Payer: Self-pay | Admitting: Obstetrics and Gynecology

## 2022-07-26 ENCOUNTER — Other Ambulatory Visit: Payer: Self-pay

## 2022-07-26 NOTE — Progress Notes (Signed)
Spoke w/ via phone for pre-op interview---pt Lab needs dos---- UPT no orders in yet              Lab results------05/24/2022 after delivery pt Hgb was 8.2 COVID test -----patient states asymptomatic no test needed Arrive at -------0830 NPO after MN NO Solid Food.  Clear liquids from MN until---0730 Med rec completed Medications to take morning of surgery -----none Diabetic medication -----n/a Patient instructed no nail polish to be worn day of surgery Patient instructed to bring photo id and insurance card day of surgery Patient aware to have Driver (ride ) / caregiver  friend Felicia Banks  for 24 hours after surgery  Patient Special Instructions -----bring breast pump  DOS Pre-Op special Istructions ----- Patient verbalized understanding of instructions that were given at this phone interview. Patient denies shortness of breath, chest pain, fever, cough at this phone interview.    Spoke with Rolla Flatten about pt's friend and pt's baby coming back to phase 2 after surgery to hear d/c instructions.  Pam Ok'd unless there is in increase in Covid and visitors are limited then do d/c instructions over the phone over phone with friend. Pt notified of above instructions.

## 2022-08-05 NOTE — H&P (Signed)
Felicia Banks is an 41 [id number] female presenting for scheduled bilateral tubal ligation. Pt recently had a NSVD in 05/2022. She has completed childbearing.   Pertinent Gynecological History: Menses:  none - recent delivery  Bleeding: none Contraception:  desires sterilization  DES exposure: denies Blood transfusions: none Sexually transmitted diseases: no past history Previous GYN Procedures:  n/a   Last mammogram:  n/a  Date:   Last pap: normal Date: 08/2021 OB History: G6, P2132   Menstrual History: Menarche age: 48 Patient's last menstrual period was 05/01/2021.    Past Medical History:  Diagnosis Date   COVID 04/19/2021   fever, headache, cough, of appitie, and mucus in her chest x 2 weeks all symtoms resolved   Family history of adverse reaction to anesthesia    Mother had complication during whiplie procedure 2019-02-08, deceased soon after surgery   HSV (herpes simplex virus) anogenital infection    Wears glasses    glasses or contacts    Past Surgical History:  Procedure Laterality Date   ANKLE SURGERY  10/2006   left   DILATATION & CURETTAGE/HYSTEROSCOPY WITH MYOSURE N/A 06/01/2021   Procedure: DILATATION & CURETTAGE/HYSTEROSCOPY WITH MYOSURE;  Surgeon: Sherlyn Hay, DO;  Location: [place];  Service: Gynecology;  Laterality: N/A;   INDUCED ABORTION      Family History  Problem Relation Age of Onset   Heart attack Mother    Diabetes Mother     Social History:  reports that she has never smoked. She has never been exposed to tobacco smoke. She has never used smokeless tobacco. She reports that she does not currently use alcohol. She reports that she does not use drugs.  Allergies: No Known Allergies  No medications prior to admission.    Review of Systems  Constitutional:  Negative for activity change and fatigue.  Eyes:  Negative for photophobia and visual disturbance.  Respiratory:  Negative for chest tightness and shortness of  breath.   Cardiovascular:  Negative for chest pain, palpitations and leg swelling.  Gastrointestinal:  Negative for abdominal pain.  Genitourinary:  Negative for menstrual problem.  Musculoskeletal:  Negative for back pain.  Neurological:  Negative for light-headedness and headaches.  Psychiatric/Behavioral:  The patient is nervous/anxious.     Height '5\' 9"'$  (1.753 m), last menstrual period 05/01/2021, currently breastfeeding. Physical Exam Constitutional:      Appearance: Normal appearance. She is normal weight.  Pulmonary:     Effort: Pulmonary effort is normal.  Abdominal:     Palpations: Abdomen is soft.  Genitourinary:    General: Normal vulva.  Musculoskeletal:        General: Normal range of motion.     Cervical back: Normal range of motion.  Skin:    General: Skin is warm and dry.     Capillary Refill: Capillary refill takes 2 to 3 seconds.  Neurological:     General: No focal deficit present.     Mental Status: She is alert and oriented to person, place, and time. Mental status is at baseline.  Psychiatric:        Mood and Affect: Mood normal.        Behavior: Behavior normal.        Thought Content: Thought content normal.        Judgment: Judgment normal.     No results found for this or any previous visit (from the past 24 hour(s)).  No results found.  Assessment/Plan: 41EX [id number] female here for scheduled  laparoscopic bilateral tubal ligation Option of salpingectomy vs fulgation discussed.  Risks/benefits of procedure reviewed ; alternatives offered Consent verified To OR when ready   Isaiah Serge 08/05/2022, 3:42 PM

## 2022-08-08 NOTE — Progress Notes (Signed)
Pt called and stated she has no child care for tomorrow and will not be having 08-09-2022 surgery, Pt stated she has left message for hannnah at dr banga office this am.

## 2022-08-09 ENCOUNTER — Ambulatory Visit (HOSPITAL_BASED_OUTPATIENT_CLINIC_OR_DEPARTMENT_OTHER)
Admission: RE | Admit: 2022-08-09 | Payer: Medicaid Other | Source: Home / Self Care | Admitting: Obstetrics and Gynecology

## 2022-08-09 DIAGNOSIS — Z01818 Encounter for other preprocedural examination: Secondary | ICD-10-CM

## 2022-08-09 SURGERY — SALPINGECTOMY, BILATERAL, LAPAROSCOPIC
Anesthesia: General | Laterality: Bilateral

## 2024-09-07 ENCOUNTER — Ambulatory Visit
Admission: RE | Admit: 2024-09-07 | Discharge: 2024-09-07 | Disposition: A | Discharge status: Home / Self Care | Payer: Self-pay | Attending: Family Medicine | Admitting: Family Medicine

## 2024-09-07 ENCOUNTER — Other Ambulatory Visit: Payer: Self-pay

## 2024-09-07 ENCOUNTER — Ambulatory Visit

## 2024-09-07 VITALS — BP 127/73 | HR 99 | Temp 99.0°F | Resp 16

## 2024-09-07 DIAGNOSIS — S93491A Sprain of other ligament of right ankle, initial encounter: Secondary | ICD-10-CM

## 2024-09-07 DIAGNOSIS — S99911A Unspecified injury of right ankle, initial encounter: Secondary | ICD-10-CM | POA: Diagnosis not present

## 2024-09-07 NOTE — ED Provider Notes (Signed)
 [facility]    CSN: [id number] Arrival date & time: 09/07/24  9148      History   Chief Complaint Chief Complaint  Patient presents with   Ankle Pain    Entered by patient    HPI Felicia Banks is a 43 y.o. female.   Patient states she was running in the yard playing with her children yesterday when she fell backwards.  She felt a crunch and severe pain in her right ankle.  She states has been painful ever since then.  Has some soft tissue swelling this morning.  States she had a similar injury to her left ankle, and it was fractured and required surgery.  Denies prior problems with right ankle.    Past Medical History:  Diagnosis Date   COVID 04/19/2021   fever, headache, cough, of appitie, and mucus in her chest x 2 weeks all symtoms resolved   Family history of adverse reaction to anesthesia    Mother had complication during whiplie procedure 2019/09/21, deceased soon after surgery   HSV (herpes simplex virus) anogenital infection    Wears glasses    glasses or contacts    Patient Active Problem List   Diagnosis Date Noted   Uterine contractions 05/22/2022   Endometrial polyp 06/01/2021   Term pregnancy 08/02/2017   SVD (spontaneous vaginal delivery) 08/02/2017   Postpartum care following vaginal delivery 08/02/2017    Past Surgical History:  Procedure Laterality Date   ANKLE SURGERY  10/2006   left   DILATATION & CURETTAGE/HYSTEROSCOPY WITH MYOSURE N/A 06/01/2021   Procedure: DILATATION & CURETTAGE/HYSTEROSCOPY WITH MYOSURE;  Surgeon: Delana Ted Morrison, DO;  Location: [facility];  Service: Gynecology;  Laterality: N/A;   INDUCED ABORTION      OB History     Gravida  5   Para  2   Term  2   Preterm      AB  3   Living  2      SAB  3   IAB  0   Ectopic      Multiple  0   Live Births  1            Home Medications    Prior to Admission medications   Medication Sig Start Date End Date Taking?  Authorizing Provider  cetirizine (ZYRTEC) 10 MG tablet Take 10 mg by mouth daily.   Yes [provider]  Ascorbic Acid (VITAMIN C WITH ROSE HIPS) 500 MG tablet Take 500 mg by mouth daily. Vitamin C Gummy with rose hips QD    [provider]  ibuprofen  (ADVIL ) 600 MG tablet Take 1 tablet (600 mg total) by mouth every 6 (six) hours as needed for moderate pain. Patient taking differently: Take 600 mg by mouth as needed for moderate pain. 06/01/21   Delana Ted Morrison, DO    Family History Family History  Problem Relation Age of Onset   Heart attack Mother    Diabetes Mother     Social History Social History   Tobacco Use   Smoking status: Never    Passive exposure: Never   Smokeless tobacco: Never  Vaping Use   Vaping status: Never Used  Substance Use Topics   Alcohol use: Not Currently   Drug use: Never     Allergies   Patient has no known allergies.   Review of Systems Review of Systems See HPI  Physical Exam Triage Vital Signs ED Triage Vitals  Encounter Vitals  Group     BP 09/07/24 0900 127/73     Girls Systolic BP Percentile --      Girls Diastolic BP Percentile --      Boys Systolic BP Percentile --      Boys Diastolic BP Percentile --      Pulse Rate 09/07/24 0900 99     Resp 09/07/24 0900 16     Temp 09/07/24 0900 99 F (37.2 C)     Temp src --      SpO2 09/07/24 0900 98 %     Weight --      Height --      Head Circumference --      Peak Flow --      Pain Score 09/07/24 0904 5     Pain Loc --      Pain Education --      Exclude from Growth Chart --    No data found.  Updated Vital Signs BP 127/73   Pulse 99   Temp 99 F (37.2 C)   Resp 16   LMP 08/10/2024 (Approximate)   SpO2 98%   Breastfeeding No       Physical Exam Constitutional:      General: She is not in acute distress.    Appearance: She is well-developed.  HENT:     Head: Normocephalic and atraumatic.  Eyes:     Conjunctiva/sclera: Conjunctivae normal.      Pupils: Pupils are equal, round, and reactive to light.  Cardiovascular:     Rate and Rhythm: Normal rate.  Pulmonary:     Effort: Pulmonary effort is normal. No respiratory distress.  Abdominal:     General: There is no distension.     Palpations: Abdomen is soft.  Musculoskeletal:        General: Swelling, tenderness and signs of injury present. Normal range of motion.     Cervical back: Normal range of motion.     Comments: Right ankle is acutely tender over the lateral malleolus.  Pain with range of motion.  No instability.  Pain with instability testing.  Skin:    General: Skin is warm and dry.     Findings: No bruising.  Neurological:     Mental Status: She is alert.     Gait: Gait abnormal.      UC Treatments / Results  Labs (all labs ordered are listed, but only abnormal results are displayed) Labs Reviewed - No data to display  EKG   Radiology DG Ankle Complete Right Result Date: 09/07/2024 CLINICAL DATA:  Fall with twisting injury and pain. EXAM: RIGHT ANKLE - COMPLETE 3+ VIEW COMPARISON:  None Available. FINDINGS: Linear transverse lucency through the distal fibula appears well corticated. No overlying soft tissue swelling. Ankle mortise is intact. No evidence of an acute fracture. IMPRESSION: No acute findings. Electronically Signed   By: Newell Eke M.D.   On: 09/07/2024 10:23    Procedures Procedures (including critical care time)  Medications Ordered in UC Medications - No data to display  Initial Impression / Assessment and Plan / UC Course  I have reviewed the triage vital signs and the nursing notes.  Pertinent labs & imaging results that were available during my care of the patient were reviewed by me and considered in my medical decision making (see chart for details).     There is an irregular appearance of the tip of the distal fibula, radiology has read this is old.  It most  it would be an avulsion injury which could be treated like a  bad sprain.  Patient requires immobilization and requested boot Final Clinical Impressions(s) / UC Diagnoses   Final diagnoses:  Sprain of anterior talofibular ligament of right ankle, initial encounter     Discharge Instructions      Wear boot until you can walk comfortably without it .  Likely 2-4 weeks Use ice and elevation to reduce pain and swelling See sports medicine if you fail to improve     ED Prescriptions   None    PDMP not reviewed this encounter.   Maranda Jamee Jacob, MD 09/07/24 [phone]

## 2024-09-07 NOTE — ED Triage Notes (Signed)
 Right ankle pain since falling back last night. Heard a crunch sound. Has been using ice, ibuprofen , elevation of extremity. Cannot bear full weight on ankle.

## 2024-09-07 NOTE — Discharge Instructions (Signed)
 Wear boot until you can walk comfortably without it .  Likely 2-4 weeks Use ice and elevation to reduce pain and swelling See sports medicine if you fail to improve
